# Patient Record
Sex: Female | Born: 1965 | Race: Black or African American | Hispanic: No | Smoking: Never smoker
Health system: Southern US, Community
[De-identification: ages and names within clinical notes are randomized; demographics above are authoritative.]

## PROBLEM LIST (undated history)

## (undated) DIAGNOSIS — T7840XA Allergy, unspecified, initial encounter: Secondary | ICD-10-CM

## (undated) DIAGNOSIS — I10 Essential (primary) hypertension: Secondary | ICD-10-CM

## (undated) HISTORY — DX: Allergy, unspecified, initial encounter: T78.40XA

## (undated) HISTORY — DX: Essential (primary) hypertension: I10

## (undated) HISTORY — PX: DILATION AND CURETTAGE OF UTERUS: SHX78

## (undated) HISTORY — PX: TUBAL LIGATION: SHX77

---

## 2006-03-31 ENCOUNTER — Ambulatory Visit: Payer: Self-pay | Admitting: Family Medicine

## 2007-03-06 ENCOUNTER — Ambulatory Visit: Payer: Self-pay | Admitting: Family Medicine

## 2007-04-24 ENCOUNTER — Ambulatory Visit: Payer: Self-pay | Admitting: Specialist

## 2009-10-26 ENCOUNTER — Ambulatory Visit: Payer: Self-pay | Admitting: Specialist

## 2012-03-13 ENCOUNTER — Ambulatory Visit: Payer: Self-pay | Admitting: Family Medicine

## 2013-11-10 DIAGNOSIS — N393 Stress incontinence (female) (male): Secondary | ICD-10-CM | POA: Insufficient documentation

## 2013-11-10 DIAGNOSIS — N281 Cyst of kidney, acquired: Secondary | ICD-10-CM | POA: Insufficient documentation

## 2015-02-16 ENCOUNTER — Other Ambulatory Visit: Payer: Self-pay | Admitting: Family Medicine

## 2015-02-17 ENCOUNTER — Other Ambulatory Visit: Payer: Self-pay

## 2015-02-17 MED ORDER — TRIAMTERENE-HCTZ 37.5-25 MG PO TABS: 1.0000 | ORAL_TABLET | Freq: Every day | ORAL | Status: DC

## 2015-03-21 ENCOUNTER — Ambulatory Visit (INDEPENDENT_AMBULATORY_CARE_PROVIDER_SITE_OTHER): Payer: BLUE CROSS/BLUE SHIELD | Admitting: Family Medicine

## 2015-03-21 ENCOUNTER — Encounter: Payer: Self-pay | Admitting: Family Medicine

## 2015-03-21 VITALS — BP 122/78 | HR 87 | Temp 98.1°F | Resp 16 | Ht 65.0 in | Wt 184.9 lb

## 2015-03-21 DIAGNOSIS — Z23 Encounter for immunization: Secondary | ICD-10-CM | POA: Diagnosis not present

## 2015-03-21 DIAGNOSIS — M2669 Other specified disorders of temporomandibular joint: Secondary | ICD-10-CM | POA: Diagnosis not present

## 2015-03-21 DIAGNOSIS — E669 Obesity, unspecified: Secondary | ICD-10-CM | POA: Diagnosis not present

## 2015-03-21 DIAGNOSIS — I10 Essential (primary) hypertension: Secondary | ICD-10-CM

## 2015-03-21 DIAGNOSIS — M26629 Arthralgia of temporomandibular joint, unspecified side: Secondary | ICD-10-CM

## 2015-03-21 MED ORDER — PHENTERMINE HCL 37.5 MG PO CAPS: 37.5000 mg | ORAL_CAPSULE | ORAL | Status: DC

## 2015-03-21 MED ORDER — MELOXICAM 15 MG PO TABS: 15.0000 mg | ORAL_TABLET | Freq: Every day | ORAL | Status: DC

## 2015-03-21 NOTE — Patient Instructions (Signed)
Temporomandibular Problems  Temporomandibular joint (TMJ) dysfunction means there are problems with the joint between your jaw and your skull. This is a joint lined by cartilage like other joints in your body but also has a small disc in the joint which keeps the bones from rubbing on each other. These joints are like other joints and can get inflamed (sore) from arthritis and other problems. When this joint gets sore, it can cause headaches and pain in the jaw and the face. CAUSES  Usually the arthritic types of problems are caused by soreness in the joint. Soreness in the joint can also be caused by overuse. This may come from grinding your teeth. It may also come from mis-alignment in the joint. DIAGNOSIS Diagnosis of this condition can often be made by history and exam. Sometimes your caregiver may need X-rays or an MRI scan to determine the exact cause. It may be necessary to see your dentist to determine if your teeth and jaws are lined up correctly. TREATMENT  Most of the time this problem is not serious; however, sometimes it can persist (become chronic). When this happens medications that will cut down on inflammation (soreness) help. Sometimes a shot of cortisone into the joint will be helpful. If your teeth are not aligned it may help for your dentist to make a splint for your mouth that can help this problem. If no physical problems can be found, the problem may come from tension. If tension is found to be the cause, biofeedback or relaxation techniques may be helpful. HOME CARE INSTRUCTIONS   Later in the day, applications of ice packs may be helpful. Ice can be used in a plastic bag with a towel around it to prevent frostbite to skin. This may be used about every 2 hours for 20 to 30 minutes, as needed while awake, or as directed by your caregiver.  Only take over-the-counter or prescription medicines for pain, discomfort, or fever as directed by your caregiver.  If physical therapy was  prescribed, follow your caregiver's directions.  Wear mouth appliances as directed if they were given. Document Released: 03/26/2001 Document Revised: 09/23/2011 Document Reviewed: 07/03/2008 ExitCare Patient Information 2015 [employer]. This information is not intended to replace advice given to you by your health care provider. Make sure you discuss any questions you have with your health care provider.  

## 2015-03-21 NOTE — Progress Notes (Signed)
Name: Amy May   MRN: [id number]    DOB: May 13, 1966   Date:03/21/2015       Progress Note  Subjective  Chief Complaint  Chief Complaint  Patient presents with  . Headache    patient states that she has had a headache for 2 weeks. patient stated that it throbs and it was nonresponsive to ibuprofen. patient has no other sx so she is unsure if it is sinus related  . Obesity  . Hypertension    HPI Hypertension   Patient presents for follow-up of hypertension. It has been present for over  5 years.  Patient states that there is compliance with medical regimen which consists of diuretic . There is no end organ disease. Cardiac risk factors include hypertension hyperlipidemia and diabetes.  Exercise regimen consist of minimal .  Diet consist of diuretic.   Obesity  Patient has a history of obesity for over 5 years.  Attempts at weight loss have included diet and exercise and phentermine intermittently .  Results of this regimen  have been effective when compliant .  Patient now voices and interest in weight loss by  good .  TMJ pain and headache  Patient complains of right-sided headache pain which usually is present in the morning. It is right in front of the right ear. It radiates sometimes antipsychotic the right face. There is no photophobia phonophobia photophobia and no focal weakness or visual disturbance. There is no tenderness over the temporal artery. There is no history of migraine headaches. Headaches are usually present in the morning. Patient admits to jaw clenching at night and to chewing on ice and hard candies. She has tried over-the-counter ibuprofen with no significant improvement.   Past Medical History  Diagnosis Date  . Allergy   . Hypertension     Social History  Substance Use Topics  . Smoking status: Never Smoker   . Smokeless tobacco: Not on file  . Alcohol Use: No     Current outpatient prescriptions:  .  phentermine (ADIPEX-P) 37.5 MG tablet, ,  Disp: , Rfl:  .  triamterene-hydrochlorothiazide (MAXZIDE-25) 37.5-25 MG per tablet, Take 1 tablet by mouth daily., Disp: 30 tablet, Rfl: 0  No Known Allergies  ROS   Objective  Filed Vitals:   03/21/15 0753  BP: 122/78  Pulse: 87  Temp: 98.1 F (36.7 C)  TempSrc: Oral  Resp: 16  Height: 5\' 5"  (1.651 m)  Weight: 184 lb 14.4 oz (83.87 kg)  SpO2: 99%     Physical Exam  Constitutional: She is oriented to person, place, and time and well-developed, well-nourished, and in no distress.  HENT:  Head: Normocephalic.  Nose: Nose normal.  Marked tenderness to palpation over the right temporomandibular joint with some crepitus is noted to well  Eyes: EOM are normal. Pupils are equal, round, and reactive to light.  Neck: Normal range of motion. No thyromegaly present.  Cardiovascular: Normal rate, regular rhythm and normal heart sounds.   No murmur heard. Pulmonary/Chest: Effort normal and breath sounds normal.  Musculoskeletal: Normal range of motion. She exhibits no edema.  Neurological: She is alert and oriented to person, place, and time. No cranial nerve deficit. Gait normal.  Skin: Skin is warm and dry. No rash noted.  Psychiatric: Memory and affect normal.      Assessment & Plan  1. TMJ pain dysfunction syndrome If no improvement consider short course of prednisone well-controlled - meloxicam (MOBIC) 15 MG tablet; Take 1 tablet (15 mg  total) by mouth daily.  Dispense: 30 tablet; Refill: 0  2. Essential hypertension Well-controlled - Comprehensive Metabolic Panel (CMET) - TSH  3. Obesity Exacerbated - phentermine 37.5 MG capsule; Take 1 capsule (37.5 mg total) by mouth every morning.  Dispense: 30 capsule; Refill: 0  4. Need for influenza vaccination Given today - Flu Vaccine QUAD 36+ mos PF IM (Fluarix & Fluzone Quad PF)

## 2015-04-19 ENCOUNTER — Ambulatory Visit: Payer: BLUE CROSS/BLUE SHIELD | Admitting: Family Medicine

## 2015-05-29 ENCOUNTER — Telehealth: Payer: Self-pay | Admitting: Family Medicine

## 2015-05-29 NOTE — Telephone Encounter (Signed)
Requesting refill on phentermine.  

## 2015-05-31 ENCOUNTER — Other Ambulatory Visit: Payer: Self-pay | Admitting: Family Medicine

## 2015-05-31 DIAGNOSIS — E669 Obesity, unspecified: Secondary | ICD-10-CM

## 2015-05-31 MED ORDER — PHENTERMINE HCL 37.5 MG PO CAPS: 37.5000 mg | ORAL_CAPSULE | ORAL | Status: DC

## 2015-07-12 ENCOUNTER — Encounter: Payer: Self-pay | Admitting: Family Medicine

## 2015-07-12 ENCOUNTER — Ambulatory Visit (INDEPENDENT_AMBULATORY_CARE_PROVIDER_SITE_OTHER): Payer: BLUE CROSS/BLUE SHIELD | Admitting: Family Medicine

## 2015-07-12 VITALS — BP 124/80 | HR 89 | Temp 98.0°F | Resp 16 | Ht 65.0 in | Wt 184.4 lb

## 2015-07-12 DIAGNOSIS — E6609 Other obesity due to excess calories: Secondary | ICD-10-CM | POA: Insufficient documentation

## 2015-07-12 DIAGNOSIS — E669 Obesity, unspecified: Secondary | ICD-10-CM | POA: Diagnosis not present

## 2015-07-12 DIAGNOSIS — I10 Essential (primary) hypertension: Secondary | ICD-10-CM | POA: Diagnosis not present

## 2015-07-12 DIAGNOSIS — E785 Hyperlipidemia, unspecified: Secondary | ICD-10-CM | POA: Diagnosis not present

## 2015-07-12 DIAGNOSIS — Z6832 Body mass index (BMI) 32.0-32.9, adult: Secondary | ICD-10-CM

## 2015-07-12 MED ORDER — PHENTERMINE HCL 37.5 MG PO CAPS: 37.5000 mg | ORAL_CAPSULE | ORAL | Status: DC

## 2015-07-12 NOTE — Progress Notes (Signed)
Name: Amy May   MRN: [id number]    DOB: 1965-08-10   Date:07/12/2015       Progress Note  Subjective  Chief Complaint  Chief Complaint  Patient presents with  . Hypertension    follow up  . Obesity    HPI  Hypertension   Patient presents for follow-up of hypertension. It has been present for over 5 years.  Patient states that there is compliance with medical regimen which consists of Maxide 25 . There is no end organ disease. Cardiac risk factors include hypertension hyperlipidemia and diabetes.  Exercise regimen consist of walking .  Diet consist of some calorie restriction  Obesity  Patient has a history of obesity for over 5 years.  Attempts at weight loss have included diet and exercise and diet and phentermine .  Results of this regimen  have been phentermine .  Patient now voices and interest in weight loss by  intermittent . Marland Kitchen Her weight has been essentially remained stable over the holidays.  Past Medical History  Diagnosis Date  . Allergy   . Hypertension     Social History  Substance Use Topics  . Smoking status: Never Smoker   . Smokeless tobacco: Not on file  . Alcohol Use: No     Current outpatient prescriptions:  .  phentermine (ADIPEX-P) 37.5 MG tablet, , Disp: , Rfl:  .  phentermine 37.5 MG capsule, Take 1 capsule (37.5 mg total) by mouth every morning., Disp: 30 capsule, Rfl: 0 .  triamterene-hydrochlorothiazide (MAXZIDE-25) 37.5-25 MG per tablet, Take 1 tablet by mouth daily., Disp: 30 tablet, Rfl: 0  No Known Allergies  Review of Systems  Constitutional: Negative for fever, chills and weight loss.  HENT: Negative for congestion, hearing loss, sore throat and tinnitus.   Eyes: Negative for blurred vision, double vision and redness.  Respiratory: Negative for cough, hemoptysis and shortness of breath.   Cardiovascular: Negative for chest pain, palpitations, orthopnea, claudication and leg swelling.  Gastrointestinal: Negative for heartburn,  nausea, vomiting, diarrhea, constipation and blood in stool.  Genitourinary: Negative for dysuria, urgency, frequency and hematuria.  Musculoskeletal: Negative for myalgias, back pain, joint pain, falls and neck pain.  Skin: Negative for itching.  Neurological: Negative for dizziness, tingling, tremors, focal weakness, seizures, loss of consciousness, weakness and headaches.  Endo/Heme/Allergies: Does not bruise/bleed easily.  Psychiatric/Behavioral: Negative for depression and substance abuse. The patient is not nervous/anxious and does not have insomnia.      Objective  Filed Vitals:   07/12/15 0832  BP: 124/80  Pulse: 89  Temp: 98 F (36.7 C)  TempSrc: Oral  Resp: 16  Height: 5\' 5"  (1.651 m)  Weight: 184 lb 6.4 oz (83.643 kg)  SpO2: 98%     Physical Exam  Constitutional: She is oriented to person, place, and time and well-developed, well-nourished, and in no distress.  Modestly obese in no acute distress  HENT:  Head: Normocephalic.  Eyes: EOM are normal. Pupils are equal, round, and reactive to light.  Neck: Normal range of motion. No thyromegaly present.  Cardiovascular: Normal rate, regular rhythm and normal heart sounds.   No murmur heard. Pulmonary/Chest: Effort normal and breath sounds normal.  Abdominal: Soft. Bowel sounds are normal.  Musculoskeletal: Normal range of motion. She exhibits no edema.  Neurological: She is alert and oriented to person, place, and time. No cranial nerve deficit. Gait normal.  Skin: Skin is warm and dry. No rash noted.  Psychiatric: Memory and affect normal.  Assessment & Plan  1. Essential hypertension Well-controlled - Comprehensive Metabolic Panel (CMET) - Lipid panel - TSH  2. Obesity Diet and exercise along with phentermine recheck in 2 months - phentermine 37.5 MG capsule; Take 1 capsule (37.5 mg total) by mouth every morning.  Dispense: 30 capsule; Refill: 1 - Comprehensive Metabolic Panel (CMET) - TSH  3.  Hyperlipidemia labs - Lipid panel

## 2015-07-13 LAB — LIPID PANEL
CHOL/HDL RATIO: 3.3 ratio (ref 0.0–4.4)
CHOLESTEROL TOTAL: 179 mg/dL (ref 100–199)
HDL: 54 mg/dL (ref >39)
LDL CALC: 106 mg/dL — AB (ref 0–99)
Triglycerides: 96 mg/dL (ref 0–149)
VLDL CHOLESTEROL CAL: 19 mg/dL (ref 5–40)

## 2015-07-13 LAB — COMPREHENSIVE METABOLIC PANEL
ALBUMIN: 4.4 g/dL (ref 3.5–5.5)
ALT: 19 IU/L (ref 0–32)
AST: 19 IU/L (ref 0–40)
Albumin/Globulin Ratio: 1.5 (ref 1.1–2.5)
Alkaline Phosphatase: 89 IU/L (ref 39–117)
BUN / CREAT RATIO: 17 (ref 9–23)
BUN: 15 mg/dL (ref 6–24)
Bilirubin Total: 0.3 mg/dL (ref 0.0–1.2)
CALCIUM: 9.5 mg/dL (ref 8.7–10.2)
CO2: 24 mmol/L (ref 18–29)
CREATININE: 0.9 mg/dL (ref 0.57–1.00)
Chloride: 103 mmol/L (ref 96–106)
GFR, EST AFRICAN AMERICAN: 86 mL/min/{1.73_m2} (ref >59)
GFR, EST NON AFRICAN AMERICAN: 75 mL/min/{1.73_m2} (ref >59)
GLOBULIN, TOTAL: 2.9 g/dL (ref 1.5–4.5)
GLUCOSE: 95 mg/dL (ref 65–99)
Potassium: 4.6 mmol/L (ref 3.5–5.2)
Sodium: 140 mmol/L (ref 134–144)
TOTAL PROTEIN: 7.3 g/dL (ref 6.0–8.5)

## 2015-07-13 LAB — TSH: TSH: 0.527 u[IU]/mL (ref 0.450–4.500)

## 2015-07-18 ENCOUNTER — Telehealth: Payer: Self-pay | Admitting: Emergency Medicine

## 2015-07-18 NOTE — Telephone Encounter (Signed)
Patient notified of labs.   

## 2015-07-19 ENCOUNTER — Encounter: Payer: Self-pay | Admitting: *Deleted

## 2015-07-27 ENCOUNTER — Ambulatory Visit: Payer: Self-pay | Admitting: General Surgery

## 2015-08-03 ENCOUNTER — Encounter: Payer: Self-pay | Admitting: *Deleted

## 2015-08-09 ENCOUNTER — Encounter: Payer: Self-pay | Admitting: Family Medicine

## 2015-09-12 ENCOUNTER — Ambulatory Visit: Payer: BLUE CROSS/BLUE SHIELD | Admitting: Family Medicine

## 2015-10-26 ENCOUNTER — Ambulatory Visit: Payer: BLUE CROSS/BLUE SHIELD | Admitting: Family Medicine

## 2015-11-29 ENCOUNTER — Ambulatory Visit: Payer: Self-pay | Admitting: General Surgery

## 2015-12-14 ENCOUNTER — Encounter: Payer: Self-pay | Admitting: *Deleted

## 2015-12-21 ENCOUNTER — Ambulatory Visit: Payer: Self-pay | Admitting: General Surgery

## 2016-01-11 ENCOUNTER — Encounter: Payer: Self-pay | Admitting: *Deleted

## 2016-10-22 ENCOUNTER — Other Ambulatory Visit: Payer: Self-pay | Admitting: Podiatry

## 2016-11-20 ENCOUNTER — Ambulatory Visit: Admission: RE | Admit: 2016-11-20 | Payer: 59 | Source: Ambulatory Visit | Admitting: Podiatry

## 2016-11-20 ENCOUNTER — Encounter: Admission: RE | Payer: Self-pay | Source: Ambulatory Visit

## 2016-11-20 SURGERY — CORRECTION, HALLUX VALGUS
Anesthesia: General | Laterality: Left

## 2016-12-25 ENCOUNTER — Encounter: Payer: BLUE CROSS/BLUE SHIELD | Admitting: Family Medicine

## 2017-01-16 ENCOUNTER — Encounter: Payer: Self-pay | Admitting: Obstetrics & Gynecology

## 2017-01-20 ENCOUNTER — Ambulatory Visit: Payer: BLUE CROSS/BLUE SHIELD | Admitting: Obstetrics & Gynecology

## 2017-02-07 ENCOUNTER — Encounter: Payer: BLUE CROSS/BLUE SHIELD | Admitting: Family Medicine

## 2018-04-09 ENCOUNTER — Encounter: Payer: BLUE CROSS/BLUE SHIELD | Admitting: Family Medicine

## 2018-04-30 ENCOUNTER — Encounter: Payer: Self-pay | Admitting: Family Medicine

## 2018-04-30 ENCOUNTER — Ambulatory Visit (INDEPENDENT_AMBULATORY_CARE_PROVIDER_SITE_OTHER): Payer: BLUE CROSS/BLUE SHIELD | Admitting: Family Medicine

## 2018-04-30 VITALS — BP 118/72 | HR 88 | Temp 98.0°F | Resp 14 | Ht 65.0 in | Wt 192.8 lb

## 2018-04-30 DIAGNOSIS — I10 Essential (primary) hypertension: Secondary | ICD-10-CM | POA: Diagnosis not present

## 2018-04-30 DIAGNOSIS — N281 Cyst of kidney, acquired: Secondary | ICD-10-CM

## 2018-04-30 DIAGNOSIS — Z1212 Encounter for screening for malignant neoplasm of rectum: Secondary | ICD-10-CM

## 2018-04-30 DIAGNOSIS — E785 Hyperlipidemia, unspecified: Secondary | ICD-10-CM

## 2018-04-30 DIAGNOSIS — Z1211 Encounter for screening for malignant neoplasm of colon: Secondary | ICD-10-CM

## 2018-04-30 DIAGNOSIS — Z114 Encounter for screening for human immunodeficiency virus [HIV]: Secondary | ICD-10-CM

## 2018-04-30 DIAGNOSIS — R52 Pain, unspecified: Secondary | ICD-10-CM | POA: Insufficient documentation

## 2018-04-30 DIAGNOSIS — Z1239 Encounter for other screening for malignant neoplasm of breast: Secondary | ICD-10-CM

## 2018-04-30 DIAGNOSIS — Z6832 Body mass index (BMI) 32.0-32.9, adult: Secondary | ICD-10-CM

## 2018-04-30 DIAGNOSIS — M25511 Pain in right shoulder: Secondary | ICD-10-CM

## 2018-04-30 DIAGNOSIS — E6609 Other obesity due to excess calories: Secondary | ICD-10-CM

## 2018-04-30 DIAGNOSIS — G8929 Other chronic pain: Secondary | ICD-10-CM

## 2018-04-30 DIAGNOSIS — E66811 Obesity, class 1: Secondary | ICD-10-CM

## 2018-04-30 DIAGNOSIS — N644 Mastodynia: Secondary | ICD-10-CM

## 2018-04-30 MED ORDER — LORCASERIN HCL 10 MG PO TABS: 10.0000 mg | ORAL_TABLET | Freq: Two times a day (BID) | ORAL | 1 refills | Status: DC

## 2018-04-30 NOTE — Progress Notes (Signed)
Name: Amy May   MRN: [id number]    DOB: 1966/03/07   Date:04/30/2018       Progress Note  Subjective  Chief Complaint  No chief complaint on file.   HPI  Body Pain: RIGHT shoulder hurts intermittently especially worse after working at [employer].  She recently started back walking and has had joint pain in her hips and bottom of her feet.  Works at [employer] as Scientist, water quality; also works for [employer] and has to wear dress shoes.  She notes that wearing flats/dress shoes causes significant pain to her knees, hips, and feet (has bunions on bilateral feet), and she requests a note to allow her to wear sneakers to work which we will provide.  We will check labs today. Denies decreased AROM, no bony tenderness, no weakness/numbness.  Breast Tenderness: This occurs intermittently; always tender, but if she gets excited or jumps/runs, she feels a stinging/tender sensation that lasts for about 1 minute. Due for mammogram; has history of dense breast tissue. No nipple discharge, rashes, skin discoloration, no nipple inversion.   Renal Cyst: Sees urology and this has been stable - sees them every 2 years. Denies any urinary symptoms.   Health Maintenance: Needs mammogram, colonoscopy, and HIV screening - will order these today  Obesity: She is walking more now - is trying to walk 1-3 miles a day. Baking more than frying now; gave up sodas, but her weight still won't budge. Declines nutritionist.  Discussed weight management in detail; discussed weight loss medication in detail and she would like to trial belviq.  We will provide this today.    HTN:  -does take medications as prescribed - current regimen includes MAxzide 37.5-25.  - taking medications as instructed, no medication side effects noted, no TIAs, no chest pain on exertion, no dyspnea on exertion, no swelling of ankles, no orthostatic dizziness or lightheadedness, no palpitations - DASH diet discussed - pt does not follow a low sodium  diet; salt added to cooking   Patient Active Problem List   Diagnosis Date Noted  . Essential hypertension 07/12/2015  . Obesity 07/12/2015  . Hyperlipidemia 07/12/2015  . Female genuine stress incontinence 11/10/2013  . Acquired cyst of kidney 11/10/2013    Past Surgical History:  Procedure Laterality Date  . DILATION AND CURETTAGE OF UTERUS    . TUBAL LIGATION      Family History  Family history unknown: Yes    Social History   Socioeconomic History  . Marital status: Divorced    Spouse name: Not on file  . Number of children: 2  . Years of education: Not on file  . Highest education level: Not on file  Occupational History  . Occupation: Retail buyer: [employer]  Social Needs  . Financial resource strain: Not hard at all  . Food insecurity:    Worry: Never true    Inability: Never true  . Transportation needs:    Medical: No    Non-medical: No  Tobacco Use  . Smoking status: Never Smoker  . Smokeless tobacco: Never Used  Substance and Sexual Activity  . Alcohol use: No    Alcohol/week: 0.0 standard drinks  . Drug use: No  . Sexual activity: Not Currently    Partners: Male  Lifestyle  . Physical activity:    Days per week: 3 days    Minutes per session: 60 min  . Stress: Not at all  Relationships  . Social connections:  Talks on phone: More than three times a week    Gets together: More than three times a week    Attends religious service: More than 4 times per year    Active member of club or organization: Yes    Attends meetings of clubs or organizations: More than 4 times per year    Relationship status: Divorced  . Intimate partner violence:    Fear of current or ex partner: No    Emotionally abused: No    Physically abused: No    Forced sexual activity: No  Other Topics Concern  . Not on file  Social History Narrative  . Not on file     Current Outpatient Medications:  .  triamterene-hydrochlorothiazide (MAXZIDE-25) 37.5-25 MG  per tablet, Take 1 tablet by mouth daily., Disp: 30 tablet, Rfl: 0 .  phentermine (ADIPEX-P) 37.5 MG tablet, , Disp: , Rfl:  .  phentermine 37.5 MG capsule, Take 1 capsule (37.5 mg total) by mouth every morning. (Patient not taking: Reported on 04/30/2018), Disp: 30 capsule, Rfl: 1 .  phentermine 37.5 MG capsule, Take 1 capsule (37.5 mg total) by mouth every morning. (Patient not taking: Reported on 04/30/2018), Disp: 30 capsule, Rfl: 1  No Known Allergies  I personally reviewed active problem list, medication list, allergies, family history, social history, health maintenance, notes from last encounter, lab results with the patient/caregiver today.   ROS Ten systems reviewed and is negative except as mentioned in HPI  Objective  Vitals:   04/30/18 1246  BP: 118/72  Pulse: 88  Resp: 14  Temp: 98 F (36.7 C)  TempSrc: Oral  Weight: 192 lb 12.8 oz (87.5 kg)  Height: 5' 5"  (1.651 m)    Body mass index is 32.08 kg/m.  Physical Exam Constitutional: Patient appears well-developed and well-nourished. No distress.  HENT: Head: Normocephalic and atraumatic. Eyes: Conjunctivae and EOM are normal. Pupils are equal, round, and reactive to light. No scleral icterus.   Neck: Normal range of motion. Neck supple. No JVD present. No thyromegaly present.  Cardiovascular: Normal rate, regular rhythm and normal heart sounds.  No murmur heard. No BLE edema. Pulmonary/Chest: Effort normal and breath sounds normal. No respiratory distress. Abdominal: Soft. Bowel sounds are normal, no distension. There is no tenderness. no masses Breast: no lumps or masses, no nipple discharge or rashes Musculoskeletal: Normal range of motion, no joint effusions. No gross deformities Neurological: he is alert and oriented to person, place, and time. No cranial nerve deficit. Coordination, balance, strength, speech and gait are normal.  Skin: Skin is warm and dry. No rash noted. No erythema.  Psychiatric: Patient has  a normal mood and affect. behavior is normal. Judgment and thought content normal.  No results found for this or any previous visit (from the past 72 hour(s)).  PHQ2/9: Depression screen [facility] 2/9 04/30/2018 07/12/2015 03/21/2015  Decreased Interest 0 0 0  Down, Depressed, Hopeless 0 0 0  PHQ - 2 Score 0 0 0  Altered sleeping 0 - -  Tired, decreased energy 0 - -  Change in appetite 0 - -  Feeling bad or failure about yourself  0 - -  Trouble concentrating 0 - -  Moving slowly or fidgety/restless 0 - -  Suicidal thoughts 0 - -  PHQ-9 Score 0 - -  Difficult doing work/chores Not difficult at all - -    Fall Risk: Fall Risk  04/30/2018 07/12/2015 03/21/2015  Falls in the past year? No No No  Assessment & Plan  1. Essential hypertension - Stable, does not need refill today, but we will provide when needed - COMPLETE METABOLIC PANEL WITH GFR  2. Class 1 obesity due to excess calories with serious comorbidity and body mass index (BMI) of 32.0 to 32.9 in adult - Discussed importance of 150 minutes of physical activity weekly, eat two servings of fish weekly, eat one serving of tree nuts ( cashews, pistachios, pecans, almonds.Marland Kitchen) every other day, eat 6 servings of fruit/vegetables daily and drink plenty of water and avoid sweet beverages.  - COMPLETE METABOLIC PANEL WITH GFR - Lipid panel - Lorcaserin HCl 10 MG TABS; Take 10 mg by mouth 2 (two) times daily.  Dispense: 60 tablet; Refill: 1 - TSH  3. Acquired cyst of kidney - CMP per orders.  4. Hyperlipidemia, unspecified hyperlipidemia type - Lipid panel  5. Breast tenderness in female - Prolactin - Mammogram per orders - Breast examination is benign.  6. Body aches - ANA - Sed Rate (ESR)  7. Chronic right shoulder pain - We will continue to monitor, ANA and Sed rate per orders; will consider ortho referral if not improving.  8. Encounter for screening for HIV - HIV Antibody (routine testing w rflx)  9. Breast cancer  screening - MM 3D SCREEN BREAST BILATERAL; Future  10. Screening for colorectal cancer - Ambulatory referral to Gastroenterology

## 2018-05-01 ENCOUNTER — Other Ambulatory Visit: Payer: Self-pay

## 2018-05-01 DIAGNOSIS — Z1211 Encounter for screening for malignant neoplasm of colon: Secondary | ICD-10-CM

## 2018-05-01 LAB — LIPID PANEL
CHOL/HDL RATIO: 4.3 (calc) (ref <5.0)
Cholesterol: 175 mg/dL (ref <200)
HDL: 41 mg/dL — AB (ref >50)
NON-HDL CHOLESTEROL (CALC): 134 mg/dL — AB (ref <130)
TRIGLYCERIDES: 412 mg/dL — AB (ref <150)

## 2018-05-01 LAB — SEDIMENTATION RATE: SED RATE: 19 mm/h (ref 0–30)

## 2018-05-01 LAB — COMPLETE METABOLIC PANEL WITH GFR
AG Ratio: 1.5 (calc) (ref 1.0–2.5)
ALT: 26 U/L (ref 6–29)
AST: 21 U/L (ref 10–35)
Albumin: 4.4 g/dL (ref 3.6–5.1)
Alkaline phosphatase (APISO): 78 U/L (ref 33–130)
BUN: 16 mg/dL (ref 7–25)
CALCIUM: 9.7 mg/dL (ref 8.6–10.4)
CO2: 29 mmol/L (ref 20–32)
CREATININE: 0.95 mg/dL (ref 0.50–1.05)
Chloride: 104 mmol/L (ref 98–110)
GFR, EST NON AFRICAN AMERICAN: 68 mL/min/{1.73_m2} (ref >=60)
GFR, Est African American: 79 mL/min/{1.73_m2} (ref >=60)
GLUCOSE: 115 mg/dL (ref 65–139)
Globulin: 3 g/dL (calc) (ref 1.9–3.7)
Potassium: 3.9 mmol/L (ref 3.5–5.3)
Sodium: 140 mmol/L (ref 135–146)
Total Bilirubin: 0.4 mg/dL (ref 0.2–1.2)
Total Protein: 7.4 g/dL (ref 6.1–8.1)

## 2018-05-01 LAB — HIV ANTIBODY (ROUTINE TESTING W REFLEX): HIV: NONREACTIVE

## 2018-05-01 LAB — PROLACTIN: Prolactin: 4.3 ng/mL

## 2018-05-01 LAB — TSH: TSH: 0.33 mIU/L — ABNORMAL LOW

## 2018-05-04 ENCOUNTER — Other Ambulatory Visit: Payer: Self-pay | Admitting: Family Medicine

## 2018-05-04 DIAGNOSIS — R7989 Other specified abnormal findings of blood chemistry: Secondary | ICD-10-CM

## 2018-05-08 ENCOUNTER — Telehealth: Payer: Self-pay | Admitting: Family Medicine

## 2018-05-08 NOTE — Telephone Encounter (Signed)
Copied from [place] [id number]. Topic: Quick Communication - See Telephone Encounter >> May 08, 2018  8:58 AM Ahmed Prima L wrote: CRM for notification. See Telephone encounter for: 05/08/18. Estill Bamberg with [employer] called to get additional information on prior authorization for [place]. Her call back is [phone] reference number is [id number]

## 2018-05-12 NOTE — Telephone Encounter (Signed)
Order sent on cover my meds

## 2018-05-19 ENCOUNTER — Telehealth: Payer: Self-pay | Admitting: Emergency Medicine

## 2018-05-19 NOTE — Telephone Encounter (Signed)
In the process of yawning she noticed swollen glands at neck. No pain, but could not swallow and felt like throat was closing up. Patient was notified to make appointment and TSH was to be repeated in 6 weeks.

## 2018-05-19 NOTE — Telephone Encounter (Signed)
Patient coming in for appointment

## 2018-05-19 NOTE — Telephone Encounter (Signed)
Documentation reviewed. If she feels that she is unable to swallow and/or having difficulty breathing, she needs to go to the ER. Otherwise I will see her for scheduled office appointment.

## 2018-05-22 ENCOUNTER — Encounter: Payer: Self-pay | Admitting: *Deleted

## 2018-05-25 ENCOUNTER — Ambulatory Visit
Admission: RE | Admit: 2018-05-25 | Discharge: 2018-05-25 | Disposition: A | Discharge status: Home / Self Care | Payer: BLUE CROSS/BLUE SHIELD | Source: Ambulatory Visit | Attending: Gastroenterology | Admitting: Gastroenterology

## 2018-05-25 ENCOUNTER — Ambulatory Visit: Payer: BLUE CROSS/BLUE SHIELD | Admitting: Anesthesiology

## 2018-05-25 ENCOUNTER — Encounter: Payer: Self-pay | Admitting: *Deleted

## 2018-05-25 ENCOUNTER — Encounter
Admission: RE | Disposition: A | Discharge status: Home / Self Care | Payer: Self-pay | Source: Ambulatory Visit | Attending: Gastroenterology

## 2018-05-25 ENCOUNTER — Other Ambulatory Visit: Payer: Self-pay | Admitting: Family Medicine

## 2018-05-25 ENCOUNTER — Ambulatory Visit: Payer: BLUE CROSS/BLUE SHIELD | Admitting: Family Medicine

## 2018-05-25 DIAGNOSIS — Z79899 Other long term (current) drug therapy: Secondary | ICD-10-CM | POA: Diagnosis not present

## 2018-05-25 DIAGNOSIS — I10 Essential (primary) hypertension: Secondary | ICD-10-CM | POA: Diagnosis not present

## 2018-05-25 DIAGNOSIS — D123 Benign neoplasm of transverse colon: Secondary | ICD-10-CM | POA: Diagnosis not present

## 2018-05-25 DIAGNOSIS — Z1211 Encounter for screening for malignant neoplasm of colon: Secondary | ICD-10-CM | POA: Diagnosis not present

## 2018-05-25 HISTORY — PX: COLONOSCOPY WITH PROPOFOL: SHX5780

## 2018-05-25 LAB — POCT PREGNANCY, URINE: Preg Test, Ur: NEGATIVE

## 2018-05-25 SURGERY — COLONOSCOPY WITH PROPOFOL
Anesthesia: General

## 2018-05-25 MED ORDER — LIDOCAINE HCL (PF) 2 % IJ SOLN
INTRAMUSCULAR | Status: AC
  Filled 2018-05-25: qty 10

## 2018-05-25 MED ORDER — SUCCINYLCHOLINE CHLORIDE 20 MG/ML IJ SOLN
INTRAMUSCULAR | Status: AC
  Filled 2018-05-25: qty 1

## 2018-05-25 MED ORDER — PROPOFOL 10 MG/ML IV BOLUS
INTRAVENOUS | Status: DC | PRN
  Administered 2018-05-25: 30 mg via INTRAVENOUS
  Administered 2018-05-25: 70 mg via INTRAVENOUS

## 2018-05-25 MED ORDER — PROPOFOL 500 MG/50ML IV EMUL
INTRAVENOUS | Status: DC | PRN
  Administered 2018-05-25: 160 ug/kg/min via INTRAVENOUS

## 2018-05-25 MED ORDER — PROPOFOL 500 MG/50ML IV EMUL
INTRAVENOUS | Status: AC
  Filled 2018-05-25: qty 200

## 2018-05-25 MED ORDER — PHENYLEPHRINE HCL 10 MG/ML IJ SOLN
INTRAMUSCULAR | Status: DC | PRN
  Administered 2018-05-25 (×2): 100 ug via INTRAVENOUS

## 2018-05-25 MED ORDER — GLYCOPYRROLATE 0.2 MG/ML IJ SOLN
INTRAMUSCULAR | Status: AC
  Filled 2018-05-25: qty 1

## 2018-05-25 MED ORDER — SODIUM CHLORIDE 0.9 % IV SOLN
INTRAVENOUS | Status: DC
  Administered 2018-05-25: 08:00:00 via INTRAVENOUS

## 2018-05-25 MED ORDER — PHENYLEPHRINE HCL 10 MG/ML IJ SOLN
INTRAMUSCULAR | Status: AC
  Filled 2018-05-25: qty 1

## 2018-05-25 NOTE — H&P (Signed)
Cephas Darby, MD [address]  [place]  [place], [place] [address]  Main: [phone]  Fax: [phone] Pager: [phone]  Primary Care Physician:  Hubbard Hartshorn, FNP Primary Gastroenterologist:  Dr. Cephas Darby  Pre-Procedure History & Physical: HPI:  Amy May is a 52 y.o. female is here for an colonoscopy.   Past Medical History:  Diagnosis Date  . Allergy   . Hypertension     Past Surgical History:  Procedure Laterality Date  . DILATION AND CURETTAGE OF UTERUS    . TUBAL LIGATION      Prior to Admission medications   Medication Sig Start Date End Date Taking? Authorizing Provider  Lorcaserin HCl 10 MG TABS Take 10 mg by mouth 2 (two) times daily. Patient not taking: Reported on 05/25/2018 04/30/18   Hubbard Hartshorn, FNP  triamterene-hydrochlorothiazide (MAXZIDE-25) 37.5-25 MG per tablet Take 1 tablet by mouth daily. 02/17/15   Ashok Norris, MD    Allergies as of 05/01/2018  . (No Known Allergies)    Family History  Family history unknown: Yes    Social History   Socioeconomic History  . Marital status: Divorced    Spouse name: Not on file  . Number of children: 2  . Years of education: Not on file  . Highest education level: Not on file  Occupational History  . Occupation: Retail buyer: [employer]  Social Needs  . Financial resource strain: Not hard at all  . Food insecurity:    Worry: Never true    Inability: Never true  . Transportation needs:    Medical: No    Non-medical: No  Tobacco Use  . Smoking status: Never Smoker  . Smokeless tobacco: Never Used  Substance and Sexual Activity  . Alcohol use: No    Alcohol/week: 0.0 standard drinks  . Drug use: No  . Sexual activity: Not Currently    Partners: Male  Lifestyle  . Physical activity:    Days per week: 3 days    Minutes per session: 60 min  . Stress: Not at all  Relationships  . Social connections:    Talks on phone: More than three times a week   Gets together: More than three times a week    Attends religious service: More than 4 times per year    Active member of club or organization: Yes    Attends meetings of clubs or organizations: More than 4 times per year    Relationship status: Divorced  . Intimate partner violence:    Fear of current or ex partner: No    Emotionally abused: No    Physically abused: No    Forced sexual activity: No  Other Topics Concern  . Not on file  Social History Narrative  . Not on file    Review of Systems: See HPI, otherwise negative ROS  Physical Exam: BP (!) 127/100   Pulse 77   Temp (!) 95.2 F (35.1 C) (Tympanic)   Resp 20   Ht 5\' 2"  (1.575 m)   Wt 88 kg   SpO2 99%   BMI 35.48 kg/m  General:   Alert,  pleasant and cooperative in NAD Head:  Normocephalic and atraumatic. Neck:  Supple; no masses or thyromegaly. Lungs:  Clear throughout to auscultation.    Heart:  Regular rate and rhythm. Abdomen:  Soft, nontender and nondistended. Normal bowel sounds, without guarding, and without rebound.   Neurologic:  Alert and  oriented x4;  grossly normal neurologically.  Impression/Plan: Amy May is here for an colonoscopy to be performed for colon cancer screening  Risks, benefits, limitations, and alternatives regarding  colonoscopy have been reviewed with the patient.  Questions have been answered.  All parties agreeable.   Sherri Sear, MD  05/25/2018, 8:15 AM

## 2018-05-25 NOTE — Transfer of Care (Signed)
Immediate Anesthesia Transfer of Care Note  Patient: MAELYN BERREY  Procedure(s) Performed: COLONOSCOPY WITH PROPOFOL (N/A )  Patient Location: PACU  Anesthesia Type:General  Level of Consciousness: drowsy  Airway & Oxygen Therapy: Patient Spontanous Breathing and Patient connected to nasal cannula oxygen  Post-op Assessment: Report given to RN and Post -op Vital signs reviewed and stable  Post vital signs: Reviewed and stable  Last Vitals:  Vitals Value Taken Time  BP 99/58 05/25/2018  8:43 AM  Temp 35.6 C 05/25/2018  8:43 AM  Pulse 86 05/25/2018  8:43 AM  Resp 14 05/25/2018  8:43 AM  SpO2 99 % 05/25/2018  8:43 AM  Vitals shown include unvalidated device data.  Last Pain:  Vitals:   05/25/18 0718  TempSrc: Tympanic  PainSc: 0-No pain         Complications: No apparent anesthesia complications

## 2018-05-25 NOTE — Anesthesia Post-op Follow-up Note (Signed)
Anesthesia QCDR form completed.        

## 2018-05-25 NOTE — Op Note (Signed)
[facility] Patient Name: Amy May Procedure Date: 05/25/2018 8:07 AM MRN: [id number] Account #: [id number] Date of Birth: [birth year] Admit Type: Outpatient Age: 52 Room: [facility] ENDO ROOM 4 Gender: Female Note Status: Finalized Procedure:            Colonoscopy Indications:          Screening for colorectal malignant neoplasm, This is                        the patient's first colonoscopy Providers:            Lin Landsman MD, MD Referring MD:         No Local Md, MD (Referring MD) Medicines:            Monitored Anesthesia Care Complications:        No immediate complications. Estimated blood loss: None. Procedure:            Pre-Anesthesia Assessment:                       - Prior to the procedure, a History and Physical was                        performed, and patient medications and allergies were                        reviewed. The patient is competent. The risks and                        benefits of the procedure and the sedation options and                        risks were discussed with the patient. All questions                        were answered and informed consent was obtained.                        Patient identification and proposed procedure were                        verified by the physician, the nurse, the                        anesthesiologist, the anesthetist and the technician in                        the pre-procedure area in the procedure room in the                        endoscopy suite. Mental Status Examination: alert and                        oriented. Airway Examination: normal oropharyngeal                        airway and neck mobility. Respiratory Examination:                        clear to auscultation. CV Examination: normal.  Prophylactic Antibiotics: The patient does not require                        prophylactic antibiotics. Prior Anticoagulants: The                         patient has taken no previous anticoagulant or                        antiplatelet agents. ASA Grade Assessment: II - A                        patient with mild systemic disease. After reviewing the                        risks and benefits, the patient was deemed in                        satisfactory condition to undergo the procedure. The                        anesthesia plan was to use monitored anesthesia care                        (MAC). Immediately prior to administration of                        medications, the patient was re-assessed for adequacy                        to receive sedatives. The heart rate, respiratory rate,                        oxygen saturations, blood pressure, adequacy of                        pulmonary ventilation, and response to care were                        monitored throughout the procedure. The physical status                        of the patient was re-assessed after the procedure.                       After obtaining informed consent, the colonoscope was                        passed under direct vision. Throughout the procedure,                        the patient's blood pressure, pulse, and oxygen                        saturations were monitored continuously. The                        Colonoscope was introduced through the anus and  advanced to the the cecum, identified by appendiceal                        orifice and ileocecal valve. The colonoscopy was                        performed without difficulty. The patient tolerated the                        procedure well. The quality of the bowel preparation                        was evaluated using the BBPS [facility], [facility] Bowel Preparation                        Scale) with scores of: Right Colon = 3, Transverse                        Colon = 3 and Left Colon = 3 (entire mucosa seen well                        with no residual staining, small fragments of stool or                         opaque liquid). The total BBPS score equals 9. Findings:      The perianal and digital rectal examinations were normal. Pertinent       negatives include normal sphincter tone and no palpable rectal lesions.      A diminutive polyp was found in the transverse colon. The polyp was       sessile. The polyp was removed with a cold biopsy forceps. Resection and       retrieval were complete.      The retroflexed view of the distal rectum and anal verge was normal and       showed no anal or rectal abnormalities.      The exam was otherwise without abnormality. Impression:           - One diminutive polyp in the transverse colon, removed                        with a cold biopsy forceps. Resected and retrieved.                       - The distal rectum and anal verge are normal on                        retroflexion view.                       - The examination was otherwise normal. Recommendation:       - Discharge patient to home (with escort).                       - Resume previous diet today.                       - Continue present medications.                       -  Await pathology results.                       - Repeat colonoscopy in 5-10 years for surveillance                        based on pathology results. Procedure Code(s):    --- Professional ---                       [phone], Colonoscopy, flexible; with biopsy, single or                        multiple Diagnosis Code(s):    --- Professional ---                       Z12.11, Encounter for screening for malignant neoplasm                        of colon                       D12.3, Benign neoplasm of transverse colon (hepatic                        flexure or splenic flexure) CPT copyright 2018 [employer]. All rights reserved. The codes documented in this report are preliminary and upon coder review may  be revised to meet current compliance requirements. Dr. Ulyess Mort Lin Landsman MD, MD 05/25/2018 8:41:22 AM This report has been signed electronically. Number of Addenda: 0 Note Initiated On: 05/25/2018 8:07 AM Scope Withdrawal Time: 0 hours 10 minutes 50 seconds  Total Procedure Duration: 0 hours 19 minutes 7 seconds       [facility]

## 2018-05-25 NOTE — Anesthesia Postprocedure Evaluation (Signed)
Anesthesia Post Note  Patient: [name]  Procedure(s) Performed: COLONOSCOPY WITH PROPOFOL (N/A )  Patient location during evaluation: Endoscopy Anesthesia Type: General Level of consciousness: awake and alert Pain management: pain level controlled Vital Signs Assessment: post-procedure vital signs reviewed and stable Respiratory status: spontaneous breathing and respiratory function stable Cardiovascular status: stable Anesthetic complications: no     Last Vitals:  Vitals:   05/25/18 0900 05/25/18 0907  BP:    Pulse: 77 72  Resp: 15 16  Temp:    SpO2: 100% 99%    Last Pain:  Vitals:   05/25/18 0718  TempSrc: Tympanic  PainSc: 0-No pain                 Caidence Higashi K

## 2018-05-25 NOTE — Anesthesia Procedure Notes (Signed)
Performed by: Pat Elicker, CRNA Pre-anesthesia Checklist: Patient identified, Emergency Drugs available, Suction available, Patient being monitored and Timeout performed Patient Re-evaluated:Patient Re-evaluated prior to induction Oxygen Delivery Method: Nasal cannula Induction Type: IV induction       

## 2018-05-25 NOTE — Anesthesia Preprocedure Evaluation (Signed)
Anesthesia Evaluation  Patient identified by MRN, date of birth, ID band Patient awake    Reviewed: Allergy & Precautions, NPO status , Patient's Chart, lab work & pertinent test results  History of Anesthesia Complications Negative for: history of anesthetic complications  Airway Mallampati: II       Dental   Pulmonary neg sleep apnea, neg COPD,           Cardiovascular hypertension, Pt. on medications (-) Past MI and (-) CHF (-) dysrhythmias (-) Valvular Problems/Murmurs     Neuro/Psych neg Seizures    GI/Hepatic Neg liver ROS, neg GERD  ,  Endo/Other  neg diabetes  Renal/GU Renal disease (renal cyst)     Musculoskeletal   Abdominal   Peds  Hematology   Anesthesia Other Findings   Reproductive/Obstetrics                             Anesthesia Physical Anesthesia Plan  ASA: II  Anesthesia Plan: General   Post-op Pain Management:    Induction:   PONV Risk Score and Plan: 3 and Propofol infusion, TIVA and Midazolam  Airway Management Planned: Nasal Cannula  Additional Equipment:   Intra-op Plan:   Post-operative Plan:   Informed Consent: I have reviewed the patients History and Physical, chart, labs and discussed the procedure including the risks, benefits and alternatives for the proposed anesthesia with the patient or authorized representative who has indicated his/her understanding and acceptance.     Plan Discussed with:   Anesthesia Plan Comments:         Anesthesia Quick Evaluation

## 2018-05-26 ENCOUNTER — Encounter: Payer: Self-pay | Admitting: Gastroenterology

## 2018-05-26 ENCOUNTER — Ambulatory Visit: Payer: Self-pay | Admitting: General Surgery

## 2018-05-26 LAB — SURGICAL PATHOLOGY

## 2018-05-26 LAB — ANA: Anti Nuclear Antibody(ANA): NEGATIVE

## 2018-06-02 ENCOUNTER — Telehealth: Payer: Self-pay | Admitting: Emergency Medicine

## 2018-06-02 NOTE — Telephone Encounter (Signed)
She needs to see ortho.  She can either go to the [facility], or I can refer her.  If she'd like referral, please place this. Thank you.

## 2018-06-02 NOTE — Telephone Encounter (Signed)
Patient notified. Will call back after she check on [facility] in [place]

## 2018-06-02 NOTE — Telephone Encounter (Signed)
All lab work came back negative. Still having shoulder pain where it feels like it is popping out of place.

## 2018-06-17 ENCOUNTER — Other Ambulatory Visit: Payer: Self-pay

## 2018-06-17 ENCOUNTER — Encounter: Payer: Self-pay | Admitting: Family Medicine

## 2018-06-17 ENCOUNTER — Other Ambulatory Visit (HOSPITAL_COMMUNITY)
Admission: RE | Admit: 2018-06-17 | Discharge: 2018-06-17 | Disposition: A | Discharge status: Home / Self Care | Payer: BLUE CROSS/BLUE SHIELD | Source: Ambulatory Visit | Attending: Family Medicine | Admitting: Family Medicine

## 2018-06-17 ENCOUNTER — Ambulatory Visit
Admission: RE | Admit: 2018-06-17 | Discharge: 2018-06-17 | Disposition: A | Discharge status: Home / Self Care | Payer: BLUE CROSS/BLUE SHIELD | Source: Ambulatory Visit | Attending: Family Medicine | Admitting: Family Medicine

## 2018-06-17 ENCOUNTER — Ambulatory Visit (INDEPENDENT_AMBULATORY_CARE_PROVIDER_SITE_OTHER): Payer: BLUE CROSS/BLUE SHIELD | Admitting: Family Medicine

## 2018-06-17 VITALS — BP 128/72 | HR 82 | Temp 98.4°F | Resp 16 | Ht 65.0 in | Wt 196.2 lb

## 2018-06-17 DIAGNOSIS — Z1239 Encounter for other screening for malignant neoplasm of breast: Secondary | ICD-10-CM | POA: Diagnosis present

## 2018-06-17 DIAGNOSIS — Z113 Encounter for screening for infections with a predominantly sexual mode of transmission: Secondary | ICD-10-CM | POA: Insufficient documentation

## 2018-06-17 DIAGNOSIS — R002 Palpitations: Secondary | ICD-10-CM

## 2018-06-17 DIAGNOSIS — E782 Mixed hyperlipidemia: Secondary | ICD-10-CM | POA: Diagnosis not present

## 2018-06-17 DIAGNOSIS — I4510 Unspecified right bundle-branch block: Secondary | ICD-10-CM

## 2018-06-17 DIAGNOSIS — Z01419 Encounter for gynecological examination (general) (routine) without abnormal findings: Secondary | ICD-10-CM | POA: Diagnosis not present

## 2018-06-17 DIAGNOSIS — Z6832 Body mass index (BMI) 32.0-32.9, adult: Secondary | ICD-10-CM

## 2018-06-17 DIAGNOSIS — N393 Stress incontinence (female) (male): Secondary | ICD-10-CM

## 2018-06-17 DIAGNOSIS — N644 Mastodynia: Secondary | ICD-10-CM | POA: Diagnosis present

## 2018-06-17 DIAGNOSIS — Z1159 Encounter for screening for other viral diseases: Secondary | ICD-10-CM

## 2018-06-17 DIAGNOSIS — E6609 Other obesity due to excess calories: Secondary | ICD-10-CM | POA: Diagnosis not present

## 2018-06-17 DIAGNOSIS — E559 Vitamin D deficiency, unspecified: Secondary | ICD-10-CM

## 2018-06-17 DIAGNOSIS — I10 Essential (primary) hypertension: Secondary | ICD-10-CM

## 2018-06-17 DIAGNOSIS — Z124 Encounter for screening for malignant neoplasm of cervix: Secondary | ICD-10-CM | POA: Insufficient documentation

## 2018-06-17 NOTE — Progress Notes (Addendum)
Name: Amy May   MRN: [id number]    DOB: 02-19-1966   Date:06/17/2018       Progress Note  Subjective  Chief Complaint  Chief Complaint  Patient presents with  . Annual Exam    HPI  Patient presents for annual CPE.  Diet: She says she doesn't eat a lot - skips breakfast, has a small lunch, and will have a small dinner - fruit, soup, etc. Exercise: Not exercising - she has had ongoing body aches, and her knees hurt.  She does have appointment with [facility] today.  USPSTF grade A and B recommendations    Office Visit from 06/17/2018 in [facility]  AUDIT-C Score  0     Depression:  Depression screen [facility] 2/9 06/17/2018 04/30/2018 07/12/2015 03/21/2015  Decreased Interest 0 0 0 0  Down, Depressed, Hopeless 0 0 0 0  PHQ - 2 Score 0 0 0 0  Altered sleeping 0 0 - -  Tired, decreased energy 0 0 - -  Change in appetite 0 0 - -  Feeling bad or failure about yourself  0 0 - -  Trouble concentrating 0 0 - -  Moving slowly or fidgety/restless 0 0 - -  Suicidal thoughts 0 0 - -  PHQ-9 Score 0 0 - -  Difficult doing work/chores Not difficult at all Not difficult at all - -   Hypertension: BP Readings from Last 3 Encounters:  06/17/18 128/72  05/25/18 105/68  04/30/18 118/72   Obesity: Wt Readings from Last 3 Encounters:  06/17/18 196 lb 3.2 oz (89 kg)  05/25/18 194 lb (88 kg)  04/30/18 192 lb 12.8 oz (87.5 kg)   BMI Readings from Last 3 Encounters:  06/17/18 32.65 kg/m  05/25/18 35.48 kg/m  04/30/18 32.08 kg/m    Hep C Screening: We will check today STD testing and prevention (HIV/chl/gon/syphilis): HIV negative 04/30/2018; We will check other testing today; same partner for 10 years. Intimate partner violence: No concerns Sexual History/Pain during Intercourse: No concerns Menstrual History/LMP/Abnormal Bleeding: Postmenopausal; no vaginal bleeding. Incontinence Symptoms: Does have some ongoing stress incontinence - only happens  occasionally.  Advanced Care Planning: A voluntary discussion about advance care planning including the explanation and discussion of advance directives.  Discussed health care proxy and Living will, and the patient was able to identify a health care proxy as Junie Panning and Ruthell Rummage. - her children.  Patient does not have a living will at present time. If patient does have living will, I have requested they bring this to the clinic to be scanned in to their chart.  Breast cancer: Due for mammogram - scheduled for today. No results found for: HMMAMMO  BRCA gene screening: No family history, no personal history Cervical cancer screening: Due today  Osteoporosis Screening: We will check vitamin D level today No results found for: HMDEXASCAN  Lipids: We will recheck today - TGD's were over 400 at last visit, but she was not fasting. Lab Results  Component Value Date   CHOL 175 04/30/2018   CHOL 179 07/12/2015   Lab Results  Component Value Date   HDL 41 (L) 04/30/2018   HDL 54 07/12/2015   Lab Results  Component Value Date   LDLCALC  04/30/2018     Comment:     . LDL cholesterol not calculated. Triglyceride levels greater than 400 mg/dL invalidate calculated LDL results. . Reference range: <100 . Desirable range <100 mg/dL for primary prevention;   <70  mg/dL for patients with CHD or diabetic patients  with > or = 2 CHD risk factors. Marland Kitchen LDL-C is now calculated using the Martin-Hopkins  calculation, which is a validated novel method providing  better accuracy than the Friedewald equation in the  estimation of LDL-C.  Cresenciano Genre et al. Annamaria Helling. 4431;540(08): 2061-2068  (http://education.[facility].com/faq/FAQ164)    LDLCALC 106 (H) 07/12/2015   Lab Results  Component Value Date   TRIG 412 (H) 04/30/2018   TRIG 96 07/12/2015   Lab Results  Component Value Date   CHOLHDL 4.3 04/30/2018   CHOLHDL 3.3 07/12/2015   No results found for: LDLDIRECT  Glucose:  Glucose   Date Value Ref Range Status  07/12/2015 95 65 - 99 mg/dL Final   Glucose, Bld  Date Value Ref Range Status  04/30/2018 115 65 - 139 mg/dL Final    Comment:    .        Non-fasting reference interval .     Skin cancer: No concerning moles or lesions Colorectal cancer: Denies family or personal history of colorectal cancer, no changes in BM's - no blood in stool, dark and tarry stool, mucus in stool, or constipation/diarrhea.   Lung cancer:  Low Dose CT Chest recommended if Age 35-80 years, 30 pack-year currently smoking OR have quit w/in 15years. Patient does not qualify.   ECG: None on file; no chest pain, shortness of breath. Endorses palpitations occasionally, also has HTN - we will check EKG today.  Patient Active Problem List   Diagnosis Date Noted  . Encounter for screening colonoscopy   . Chronic right shoulder pain 04/30/2018  . Body aches 04/30/2018  . Essential hypertension 07/12/2015  . Class 1 obesity due to excess calories with serious comorbidity and body mass index (BMI) of 32.0 to 32.9 in adult 07/12/2015  . Hyperlipidemia 07/12/2015  . Female genuine stress incontinence 11/10/2013  . Acquired cyst of kidney 11/10/2013    Past Surgical History:  Procedure Laterality Date  . COLONOSCOPY WITH PROPOFOL N/A 05/25/2018   Procedure: COLONOSCOPY WITH PROPOFOL;  Surgeon: Lin Landsman, MD;  Location[facility] ENDOSCOPY;  Service: Gastroenterology;  Laterality: N/A;  . DILATION AND CURETTAGE OF UTERUS    . TUBAL LIGATION      Family History  Family history unknown: Yes    Social History   Socioeconomic History  . Marital status: Divorced    Spouse name: Not on file  . Number of children: 2  . Years of education: Not on file  . Highest education level: Not on file  Occupational History  . Occupation: Retail buyer: [employer]  Social Needs  . Financial resource strain: Not hard at all  . Food insecurity:    Worry: Never true    Inability: Never  true  . Transportation needs:    Medical: No    Non-medical: No  Tobacco Use  . Smoking status: Never Smoker  . Smokeless tobacco: Never Used  Substance and Sexual Activity  . Alcohol use: No    Alcohol/week: 0.0 standard drinks  . Drug use: No  . Sexual activity: Not Currently    Partners: Male  Lifestyle  . Physical activity:    Days per week: 1 day    Minutes per session: 30 min  . Stress: Not at all  Relationships  . Social connections:    Talks on phone: More than three times a week    Gets together: More than three times a week  Attends religious service: More than 4 times per year    Active member of club or organization: Yes    Attends meetings of clubs or organizations: More than 4 times per year    Relationship status: Divorced  . Intimate partner violence:    Fear of current or ex partner: No    Emotionally abused: No    Physically abused: No    Forced sexual activity: No  Other Topics Concern  . Not on file  Social History Narrative  . Not on file     Current Outpatient Medications:  .  triamterene-hydrochlorothiazide (MAXZIDE-25) 37.5-25 MG per tablet, Take 1 tablet by mouth daily., Disp: 30 tablet, Rfl: 0  No Known Allergies   ROS  Constitutional: Negative for fever or weight change.  Respiratory: Negative for cough and shortness of breath.   Cardiovascular: Negative for chest pain.  Endorses occasional Palpitaiton  Gastrointestinal: Negative for abdominal pain, no bowel changes.  Musculoskeletal: Negative for gait problem or joint swelling.  Skin: Negative for rash.  Neurological: Negative for dizziness or headache.  No other specific complaints in a complete review of systems (except as listed in HPI above).  Objective  Vitals:   06/17/18 0706  BP: 128/72  Pulse: 82  Resp: 16  Temp: 98.4 F (36.9 C)  TempSrc: Oral  SpO2: 96%  Weight: 196 lb 3.2 oz (89 kg)  Height: 5' 5"  (1.651 m)    Body mass index is 32.65 kg/m.  Physical  Exam Constitutional: Patient appears well-developed and well-nourished. No distress.  HENT: Head: Normocephalic and atraumatic. Ears: B TMs ok, no erythema or effusion; Nose: Nose normal. Mouth/Throat: Oropharynx is clear and moist. No oropharyngeal exudate.  Eyes: Conjunctivae and EOM are normal. Pupils are equal, round, and reactive to light. No scleral icterus.  Neck: Normal range of motion. Neck supple. No JVD present. No thyromegaly present.  Cardiovascular: Normal rate, regular rhythm and normal heart sounds.  No murmur heard. No BLE edema. Pulmonary/Chest: Effort normal and breath sounds normal. No respiratory distress. Abdominal: Soft. Bowel sounds are normal, no distension. There is no tenderness. no masses Breast: no lumps or masses, no nipple discharge or rashes FEMALE GENITALIA:  External genitalia normal External urethra normal Vaginal vault normal without discharge or lesions Cervix normal without discharge or lesions Bimanual exam normal without masses RECTAL: no rectal masses or hemorrhoids Musculoskeletal: Normal range of motion, no joint effusions. No gross deformities Neurological: he is alert and oriented to person, place, and time. No cranial nerve deficit. Coordination, balance, strength, speech and gait are normal.  Skin: Skin is warm and dry. No rash noted. No erythema.  Psychiatric: Patient has a normal mood and affect. behavior is normal. Judgment and thought content normal.   Recent Results (from the past 2160 hour(s))  Prolactin     Status: None   Collection Time: 04/30/18  2:22 PM  Result Value Ref Range   Prolactin 4.3 ng/mL    Comment:             Reference Range  Females         Non-pregnant        3.0-30.0         Pregnant           10.0-209.0         Postmenopausal      2.0-20.0 . . .   Sed Rate (ESR)     Status: None   Collection Time: 04/30/18  2:22 PM  Result Value Ref Range   Sed Rate 19 0 - 30 mm/h  COMPLETE METABOLIC PANEL WITH GFR      Status: None   Collection Time: 04/30/18  2:22 PM  Result Value Ref Range   Glucose, Bld 115 65 - 139 mg/dL    Comment: .        Non-fasting reference interval .    BUN 16 7 - 25 mg/dL   Creat 0.95 0.50 - 1.05 mg/dL    Comment: For patients >1 years of age, the reference limit for Creatinine is approximately 13% higher for people identified as African-American. .    GFR, Est Non African American 68 > OR = 60 mL/min/1.58m   GFR, Est African American 79 > OR = 60 mL/min/1.777m  BUN/Creatinine Ratio NOT APPLICABLE 6 - 22 (calc)   Sodium 140 135 - 146 mmol/L   Potassium 3.9 3.5 - 5.3 mmol/L   Chloride 104 98 - 110 mmol/L   CO2 29 20 - 32 mmol/L   Calcium 9.7 8.6 - 10.4 mg/dL   Total Protein 7.4 6.1 - 8.1 g/dL   Albumin 4.4 3.6 - 5.1 g/dL   Globulin 3.0 1.9 - 3.7 g/dL (calc)   AG Ratio 1.5 1.0 - 2.5 (calc)   Total Bilirubin 0.4 0.2 - 1.2 mg/dL   Alkaline phosphatase (APISO) 78 33 - 130 U/L   AST 21 10 - 35 U/L   ALT 26 6 - 29 U/L  Lipid panel     Status: Abnormal   Collection Time: 04/30/18  2:22 PM  Result Value Ref Range   Cholesterol 175 <200 mg/dL   HDL 41 (L) >50 mg/dL   Triglycerides 412 (H) <150 mg/dL    Comment: . If a non-fasting specimen was collected, consider repeat triglyceride testing on a fasting specimen if clinically indicated.  JaYates Decampt al. J. of Clin. Lipidol. 203546;5:681-275. Marland Kitchen  LDL Cholesterol (Calc)  mg/dL (calc)    Comment: . LDL cholesterol not calculated. Triglyceride levels greater than 400 mg/dL invalidate calculated LDL results. . Reference range: <100 . Desirable range <100 mg/dL for primary prevention;   <70 mg/dL for patients with CHD or diabetic patients  with > or = 2 CHD risk factors. . Marland KitchenDL-C is now calculated using the Martin-Hopkins  calculation, which is a validated novel method providing  better accuracy than the Friedewald equation in the  estimation of LDL-C.  MaCresenciano Genret al. JAAnnamaria Helling201700;174(94 2061-2068   (http://education.[facility].com/faq/FAQ164)    Total CHOL/HDL Ratio 4.3 <5.0 (calc)   Non-HDL Cholesterol (Calc) 134 (H) <130 mg/dL (calc)    Comment: For patients with diabetes plus 1 major ASCVD risk  factor, treating to a non-HDL-C goal of <100 mg/dL  (LDL-C of <70 mg/dL) is considered a therapeutic  option.   HIV Antibody (routine testing w rflx)     Status: None   Collection Time: 04/30/18  2:22 PM  Result Value Ref Range   HIV 1&2 Ab, 4th Generation NON-REACTIVE NON-REACTI    Comment: HIV-1 antigen and HIV-1/HIV-2 antibodies were not detected. There is no laboratory evidence of HIV infection. . Marland KitchenLEASE NOTE: This information has been disclosed to you from records whose confidentiality may be protected by state law.  If your state requires such protection, then the state law prohibits you from making any further disclosure of the information without the specific written consent of the person to whom it pertains, or as otherwise permitted by law. A general authorization for the  release of medical or other information is NOT sufficient for this purpose. . For additional information please refer to http://education.[facility].com/faq/FAQ106 (This link is being provided for informational/ educational purposes only.) . Marland Kitchen The performance of this assay has not been clinically validated in patients less than 68 years old. .   TSH     Status: Abnormal   Collection Time: 04/30/18  2:22 PM  Result Value Ref Range   TSH 0.33 (L) mIU/L    Comment:           Reference Range .           > or = 20 Years  0.40-4.50 .                Pregnancy Ranges           First trimester    0.26-2.66           Second trimester   0.55-2.73           Third trimester    0.43-2.91   Pregnancy, urine POC     Status: None   Collection Time: 05/25/18  7:25 AM  Result Value Ref Range   Preg Test, Ur NEGATIVE NEGATIVE    Comment:        THE SENSITIVITY OF THIS METHODOLOGY IS >24  mIU/mL   Surgical pathology     Status: None   Collection Time: 05/25/18  8:31 AM  Result Value Ref Range   SURGICAL PATHOLOGY      Surgical Pathology CASE: [id number] PATIENT: Verdean Bohannon Surgical Pathology Report     SPECIMEN SUBMITTED: A. Colon polyp, transverse; cbx  CLINICAL HISTORY: None provided  PRE-OPERATIVE DIAGNOSIS: Screening colonoscopy  POST-OPERATIVE DIAGNOSIS: Colon polyp     DIAGNOSIS: A. COLON POLYP, TRANSVERSE; COLD BIOPSY: - TUBULAR ADENOMA. - NEGATIVE FOR HIGH-GRADE DYSPLASIA AND MALIGNANCY.   GROSS DESCRIPTION: A. Labeled: Transverse colon polyp cbx Received: In formalin Tissue fragment(s): 2 Size: 0.3-0.4 cm Description: Pink-tan fragments Entirely submitted in one cassette.    Final Diagnosis performed by Allena Napoleon, MD.   Electronically signed 05/26/2018 11:28:02AM The electronic signature indicates that the named Attending Pathologist has evaluated the specimen  Technical component performed at [facility], [address], [place], [place] [address] Lab: [phone] Dir: Rush Farmer, MD, MMM  Professional component performed at [facility], [facility], [place], [place], [place] [address] Lab: [phone] Dir: Dellia Nims. Rubinas, MD   ANA     Status: None   Collection Time: 05/25/18  9:34 AM  Result Value Ref Range   Anti Nuclear Antibody(ANA) NEGATIVE NEGATIVE    Comment: ANA IFA is a first line screen for detecting the presence of up to approximately 150 autoantibodies in various autoimmune diseases. A negative ANA IFA result suggests an ANA-associated autoimmune disease is not present at this time, but is not definitive. If there is high clinical suspicion for Sjogren's syndrome, testing for anti-SS-A/Ro antibody should be considered. Anti-Jo-1 antibody should be considered for clinically suspected inflammatory myopathies. . AC-0: Negative . International Consensus on ANA  Patterns (https://www.hernandez-brewer.com/) . For additional information, please refer to http://education.[facility].com/faq/FAQ177 (This link is being provided for informational/ educational purposes only.) .     PHQ2/9: Depression screen [facility] 2/9 06/17/2018 04/30/2018 07/12/2015 03/21/2015  Decreased Interest 0 0 0 0  Down, Depressed, Hopeless 0 0 0 0  PHQ - 2 Score 0 0 0 0  Altered sleeping 0 0 - -  Tired, decreased energy 0 0 - -  Change in appetite 0 0 - -  Feeling bad or failure about yourself  0 0 - -  Trouble concentrating 0 0 - -  Moving slowly or fidgety/restless 0 0 - -  Suicidal thoughts 0 0 - -  PHQ-9 Score 0 0 - -  Difficult doing work/chores Not difficult at all Not difficult at all - -    Fall Risk: Fall Risk  06/17/2018 04/30/2018 07/12/2015 03/21/2015  Falls in the past year? 0 No No No  Number falls in past yr: 0 - - -  Injury with Fall? 0 - - -   Assessment & Plan  1. Well woman exam -USPSTF grade A and B recommendations reviewed with patient; age-appropriate recommendations, preventive care, screening tests, etc discussed and encouraged; healthy living encouraged; see AVS for patient education given to patient -Discussed importance of 150 minutes of physical activity weekly, eat two servings of fish weekly, eat one serving of tree nuts ( cashews, pistachios, pecans, almonds.Marland Kitchen) every other day, eat 6 servings of fruit/vegetables daily and drink plenty of water and avoid sweet beverages.  - Cytology - PAP - Hepatitis C antibody - RPR  2. Mixed hyperlipidemia - Lipid panel  3. Class 1 obesity due to excess calories with serious comorbidity and body mass index (BMI) of 32.0 to 32.9 in adult - See above regarding teaching  4. Cervical cancer screening - Cytology - PAP  5. Need for hepatitis C screening test - Hepatitis C antibody  6. Routine screening for STI (sexually transmitted infection) - Cytology - PAP - Hepatitis C antibody -  RPR  7. Female genuine stress incontinence - Kegel exercises discussed in detail.  8. Vitamin D deficiency - Vitamin D (25 hydroxy)  9. Palpitations - EKG 12-Lead  10. Essential hypertension - EKG 12-Lead - COMPLETE METABOLIC PANEL WITH GFR - Ambulatory referral to Cardiology  11. Right bundle branch block - COMPLETE METABOLIC PANEL WITH GFR - Magnesium - Ambulatory referral to Cardiology - RBB with borderline wide QRS complex on EKG - discussed with Dr. Ancil Boozer who agrees with plan to send to cardiology and change electrolytes. Pt is well-appearing today without palpitations or chest pain.

## 2018-06-17 NOTE — Addendum Note (Signed)
Addended by: Raelyn Ensign E on: 06/17/2018 11:19 AM   Modules accepted: Orders

## 2018-06-17 NOTE — Patient Instructions (Addendum)
Preventive Care 40-64 Years, Female Preventive care refers to lifestyle choices and visits with your health care provider that can promote health and wellness. What does preventive care include?  A yearly physical exam. This is also called an annual well check.  Dental exams once or twice a year.  Routine eye exams. Ask your health care provider how often you should have your eyes checked.  Personal lifestyle choices, including: ? Daily care of your teeth and gums. ? Regular physical activity. ? Eating a healthy diet. ? Avoiding tobacco and drug use. ? Limiting alcohol use. ? Practicing safe sex. ? Taking low-dose aspirin daily starting at age 62. ? Taking vitamin and mineral supplements as recommended by your health care provider. What happens during an annual well check? The services and screenings done by your health care provider during your annual well check will depend on your age, overall health, lifestyle risk factors, and family history of disease. Counseling Your health care provider may ask you questions about your:  Alcohol use.  Tobacco use.  Drug use.  Emotional well-being.  Home and relationship well-being.  Sexual activity.  Eating habits.  Work and work Statistician.  Method of birth control.  Menstrual cycle.  Pregnancy history.  Screening You may have the following tests or measurements:  Height, weight, and BMI.  Blood pressure.  Lipid and cholesterol levels. These may be checked every 5 years, or more frequently if you are over 72 years old.  Skin check.  Lung cancer screening. You may have this screening every year starting at age 29 if you have a 30-pack-year history of smoking and currently smoke or have quit within the past 15 years.  Fecal occult blood test (FOBT) of the stool. You may have this test every year starting at age 30.  Flexible sigmoidoscopy or colonoscopy. You may have a sigmoidoscopy every 5 years or a colonoscopy  every 10 years starting at age 29.  Hepatitis C blood test.  Hepatitis B blood test.  Sexually transmitted disease (STD) testing.  Diabetes screening. This is done by checking your blood sugar (glucose) after you have not eaten for a while (fasting). You may have this done every 1-3 years.  Mammogram. This may be done every 1-2 years. Talk to your health care provider about when you should start having regular mammograms. This may depend on whether you have a family history of breast cancer.  BRCA-related cancer screening. This may be done if you have a family history of breast, ovarian, tubal, or peritoneal cancers.  Pelvic exam and Pap test. This may be done every 3 years starting at age 38. Starting at age 4, this may be done every 5 years if you have a Pap test in combination with an HPV test.  Bone density scan. This is done to screen for osteoporosis. You may have this scan if you are at high risk for osteoporosis.  Discuss your test results, treatment options, and if necessary, the need for more tests with your health care provider. Vaccines Your health care provider may recommend certain vaccines, such as:  Influenza vaccine. This is recommended every year.  Tetanus, diphtheria, and acellular pertussis (Tdap, Td) vaccine. You may need a Td booster every 10 years.  Varicella vaccine. You may need this if you have not been vaccinated.  Zoster vaccine. You may need this after age 13.  Measles, mumps, and rubella (MMR) vaccine. You may need at least one dose of MMR if you were born in  1958 or later. You may also need a second dose.  Pneumococcal 13-valent conjugate (PCV13) vaccine. You may need this if you have certain conditions and were not previously vaccinated.  Pneumococcal polysaccharide (PPSV23) vaccine. You may need one or two doses if you smoke cigarettes or if you have certain conditions.  Meningococcal vaccine. You may need this if you have certain  conditions.  Hepatitis A vaccine. You may need this if you have certain conditions or if you travel or work in places where you may be exposed to hepatitis A.  Hepatitis B vaccine. You may need this if you have certain conditions or if you travel or work in places where you may be exposed to hepatitis B.  Haemophilus influenzae type b (Hib) vaccine. You may need this if you have certain conditions.  Talk to your health care provider about which screenings and vaccines you need and how often you need them. This information is not intended to replace advice given to you by your health care provider. Make sure you discuss any questions you have with your health care provider. Document Released: 07/28/2015 Document Revised: 03/20/2016 Document Reviewed: 05/02/2015 Elsevier Interactive Patient Education  2018 [place] are compounds that affect the level of uric acid in your body. A low-purine diet is a diet that is low in purines. Eating a low-purine diet can prevent the level of uric acid in your body from getting too high and causing gout or kidney stones or both. What do I need to know about this diet?  Choose low-purine foods. Examples of low-purine foods are listed in the next section.  Drink plenty of fluids, especially water. Fluids can help remove uric acid from your body. Try to drink 8-16 cups (1.9-3.8 L) a day.  Limit foods high in fat, especially saturated fat, as fat makes it harder for the body to get rid of uric acid. Foods high in saturated fat include pizza, cheese, ice cream, whole milk, fried foods, and gravies. Choose foods that are lower in fat and lean sources of protein. Use olive oil when cooking as it contains healthy fats that are not high in saturated fat.  Limit alcohol. Alcohol interferes with the elimination of uric acid from your body. If you are having a gout attack, avoid all alcohol.  Keep in mind that different people's bodies react  differently to different foods. You will probably learn over time which foods do or do not affect you. If you discover that a food tends to cause your gout to flare up, avoid eating that food. You can more freely enjoy foods that do not cause problems. If you have any questions about a food item, talk to your dietitian or health care provider. Which foods are low, moderate, and high in purines? The following is a list of foods that are low, moderate, and high in purines. You can eat any amount of the foods that are low in purines. You may be able to have small amounts of foods that are moderate in purines. Ask your health care provider how much of a food moderate in purines you can have. Avoid foods high in purines. Grains  Foods low in purines: Enriched white bread, pasta, rice, cake, cornbread, popcorn.  Foods moderate in purines: Whole-grain breads and cereals, wheat germ, bran, oatmeal. Uncooked oatmeal. Dry wheat bran or wheat germ.  Foods high in purines: Pancakes, [place] toast, biscuits, muffins. Vegetables  Foods low in purines: All vegetables, except those that  are moderate in purines.  Foods moderate in purines: Asparagus, cauliflower, spinach, mushrooms, green peas. Fruits  All fruits are low in purines. Meats and other Protein Foods  Foods low in purines: Eggs, nuts, peanut butter.  Foods moderate in purines: 80-90% lean beef, lamb, veal, pork, poultry, fish, eggs, peanut butter, nuts. Crab, lobster, oysters, and shrimp. Cooked dried beans, peas, and lentils.  Foods high in purines: Anchovies, sardines, herring, mussels, tuna, codfish, scallops, trout, and haddock. [name]. Organ meats (such as liver or kidney). [place]. Game meat. Goose. Sweetbreads. Dairy  All dairy foods are low in purines. Low-fat and fat-free dairy products are best because they are low in saturated fat. Beverages  Drinks low in purines: Water, carbonated beverages, tea, coffee, cocoa.  Drinks moderate in  purines: Soft drinks and other drinks sweetened with high-fructose corn syrup. Juices. To find whether a food or drink is sweetened with high-fructose corn syrup, look at the ingredients list.  Drinks high in purines: Alcoholic beverages (such as beer). Condiments  Foods low in purines: Salt, herbs, olives, pickles, relishes, vinegar.  Foods moderate in purines: Butter, margarine, oils, mayonnaise. Fats and Oils  Foods low in purines: All types, except gravies and sauces made with meat.  Foods high in purines: Gravies and sauces made with meat. Other Foods  Foods low in purines: Sugars, sweets, gelatin. Cake. Soups made without meat.  Foods moderate in purines: Meat-based or fish-based soups, broths, or bouillons. Foods and drinks sweetened with high-fructose corn syrup.  Foods high in purines: High-fat desserts (such as ice cream, cookies, cakes, pies, doughnuts, and chocolate). Contact your dietitian for more information on foods that are not listed here. This information is not intended to replace advice given to you by your health care provider. Make sure you discuss any questions you have with your health care provider. Document Released: 10/26/2010 Document Revised: 12/07/2015 Document Reviewed: 06/07/2013 Elsevier Interactive Patient Education  2017 [place].   Kegel Exercises Kegel exercises help strengthen the muscles that support the rectum, vagina, small intestine, bladder, and uterus. Doing Kegel exercises can help:  Improve bladder and bowel control.  Improve sexual response.  Reduce problems and discomfort during pregnancy.  Kegel exercises involve squeezing your pelvic floor muscles, which are the same muscles you squeeze when you try to stop the flow of urine. The exercises can be done while sitting, standing, or lying down, but it is best to vary your position. Phase 1 exercises 1. Squeeze your pelvic floor muscles tight. You should feel a tight lift in your  rectal area. If you are a female, you should also feel a tightness in your vaginal area. Keep your stomach, buttocks, and legs relaxed. 2. Hold the muscles tight for up to 10 seconds. 3. Relax your muscles. Repeat this exercise 50 times a day or as many times as told by your health care provider. Continue to do this exercise for at least 4-6 weeks or for as long as told by your health care provider. This information is not intended to replace advice given to you by your health care provider. Make sure you discuss any questions you have with your health care provider. Document Released: 06/17/2012 Document Revised: 02/24/2016 Document Reviewed: 05/21/2015 Elsevier Interactive Patient Education  [employer].

## 2018-06-18 LAB — LIPID PANEL
Cholesterol: 182 mg/dL (ref <200)
HDL: 51 mg/dL (ref >50)
LDL CHOLESTEROL (CALC): 110 mg/dL — AB
NON-HDL CHOLESTEROL (CALC): 131 mg/dL — AB (ref <130)
TRIGLYCERIDES: 107 mg/dL (ref <150)
Total CHOL/HDL Ratio: 3.6 (calc) (ref <5.0)

## 2018-06-18 LAB — THYROID PANEL WITH TSH
Free Thyroxine Index: 3 (ref 1.4–3.8)
T3 Uptake: 31 % (ref 22–35)
T4, Total: 9.8 ug/dL (ref 5.1–11.9)
TSH: 0.5 mIU/L

## 2018-06-18 LAB — CYTOLOGY - PAP
Chlamydia: NEGATIVE
DIAGNOSIS: NEGATIVE
HPV: NOT DETECTED
NEISSERIA GONORRHEA: NEGATIVE

## 2018-06-18 LAB — COMPLETE METABOLIC PANEL WITH GFR
AG Ratio: 1.4 (calc) (ref 1.0–2.5)
ALBUMIN MSPROF: 4.3 g/dL (ref 3.6–5.1)
ALKALINE PHOSPHATASE (APISO): 79 U/L (ref 33–130)
ALT: 28 U/L (ref 6–29)
AST: 20 U/L (ref 10–35)
BILIRUBIN TOTAL: 0.4 mg/dL (ref 0.2–1.2)
BUN: 15 mg/dL (ref 7–25)
CHLORIDE: 102 mmol/L (ref 98–110)
CO2: 30 mmol/L (ref 20–32)
Calcium: 9.9 mg/dL (ref 8.6–10.4)
Creat: 0.86 mg/dL (ref 0.50–1.05)
GFR, EST AFRICAN AMERICAN: 89 mL/min/{1.73_m2} (ref >=60)
GFR, Est Non African American: 77 mL/min/{1.73_m2} (ref >=60)
Globulin: 3 g/dL (calc) (ref 1.9–3.7)
Glucose, Bld: 88 mg/dL (ref 65–99)
Potassium: 3.8 mmol/L (ref 3.5–5.3)
SODIUM: 141 mmol/L (ref 135–146)
TOTAL PROTEIN: 7.3 g/dL (ref 6.1–8.1)

## 2018-06-18 LAB — MAGNESIUM: MAGNESIUM: 2.1 mg/dL (ref 1.5–2.5)

## 2018-06-18 LAB — HEPATITIS C ANTIBODY
HEP C AB: NONREACTIVE
SIGNAL TO CUT-OFF: 0.02 (ref <1.00)

## 2018-06-18 LAB — RPR: RPR Ser Ql: NONREACTIVE

## 2018-06-18 LAB — VITAMIN D 25 HYDROXY (VIT D DEFICIENCY, FRACTURES): VIT D 25 HYDROXY: 24 ng/mL — AB (ref 30–100)

## 2018-07-05 DIAGNOSIS — R002 Palpitations: Secondary | ICD-10-CM | POA: Insufficient documentation

## 2018-07-05 DIAGNOSIS — R9431 Abnormal electrocardiogram [ECG] [EKG]: Secondary | ICD-10-CM | POA: Insufficient documentation

## 2018-07-05 NOTE — Progress Notes (Signed)
Cardiology Office Note  Date:  07/06/2018   ID:  Amy May, DOB 10-Feb-1966, MRN [id number]  PCP:  Hubbard Hartshorn, FNP   Chief Complaint  Patient presents with  . New Patient (Initial Visit)    palpatations comes and goes feels like a flutter has only happened once since 06/17/2018. Coughs and it resolves. Medications reviewed verbally.    HPI:  Amy May is a 52 year old woman with past medical history of  hypertension Gout Referred by Raelyn Ensign for consultation of her Abnormal EKG/RBBB, palpitations, HTN  She reports having tachycardia/palpitations presenting approximately 1 year ago Symptoms seem to be getting more notable, Describes it as a rapid fluttering lasting for several seconds or less Often will lose her breath, makes her cough Happened once from 12/4 to today Does not happen on a regular basis, no precipitating triggers  Otherwise very active at baseline, no regular exercise program  She does drink several cups of coffee per day but has been doing so for many years  EKG personally reviewed by myself on todays visit Shows normal sinus rhythm with rate 72 bpm right bundle branch block   PMH:   has a past medical history of Allergy and Hypertension.  PSH:    Past Surgical History:  Procedure Laterality Date  . COLONOSCOPY WITH PROPOFOL N/A 05/25/2018   Procedure: COLONOSCOPY WITH PROPOFOL;  Surgeon: Lin Landsman, MD;  Location: [facility] ENDOSCOPY;  Service: Gastroenterology;  Laterality: N/A;  . DILATION AND CURETTAGE OF UTERUS    . TUBAL LIGATION      Current Outpatient Medications  Medication Sig Dispense Refill  . allopurinol (ZYLOPRIM) 100 MG tablet Take 100 mg by mouth as needed. For gout flare    . meloxicam (MOBIC) 15 MG tablet meloxicam 15 mg tablet  TAKE 1 TABLET BY MOUTH EVERY DAY    . methocarbamol (ROBAXIN) 750 MG tablet methocarbamol 750 mg tablet  TAKE 1 TABLET BY MOUTH THREE TIMES A DAY    .  triamterene-hydrochlorothiazide (MAXZIDE-25) 37.5-25 MG per tablet Take 1 tablet by mouth daily. 30 tablet 0   No current facility-administered medications for this visit.      Allergies:   Patient has no known allergies.   Social History:  The patient  reports that she has never smoked. She has never used smokeless tobacco. She reports that she does not drink alcohol or use drugs.   Family History:   family history includes Heart disease in her maternal aunt.    Review of Systems: ROS   PHYSICAL EXAM: VS:  BP 128/78 (BP Location: Right Arm, Patient Position: Sitting, Cuff Size: Normal)   Pulse 72   Ht 5\' 2"  (1.575 m)   Wt 197 lb (89.4 kg)   BMI 36.03 kg/m  , BMI Body mass index is 36.03 kg/m. GEN: Well nourished, well developed, in no acute distress  HEENT: normal  Neck: no JVD, carotid bruits, or masses Cardiac: RRR; no murmurs, rubs, or gallops,no edema  Respiratory:  clear to auscultation bilaterally, normal work of breathing GI: soft, nontender, nondistended, + BS MS: no deformity or atrophy  Skin: warm and dry, no rash Neuro:  Strength and sensation are intact Psych: euthymic mood, full affect    Recent Labs: 06/17/2018: ALT 28; BUN 15; Creat 0.86; Magnesium 2.1; Potassium 3.8; Sodium 141; TSH 0.50    Lipid Panel Lab Results  Component Value Date   CHOL 182 06/17/2018   HDL 51 06/17/2018   LDLCALC 110 (H) 06/17/2018  TRIG 107 06/17/2018      Wt Readings from Last 3 Encounters:  07/06/18 197 lb (89.4 kg)  06/17/18 196 lb 3.2 oz (89 kg)  05/25/18 194 lb (88 kg)       ASSESSMENT AND PLAN:  Paroxysmal tachycardia (HCC) Concerning for either short run of atrial tachycardia or SVT Each of these arrhythmias was discussed with her in detail Very rare episodes, she does not want medications at this time Would recommend beta-blockers if symptoms get worse We did discuss long-term event monitor, suggested she call our office if she has increasing  frequency intensity or duration For now we will continue to monitor Less likely secondary to coffee or other stimulants  Abnormal EKG Right bundle branch block discussed with her Benign finding, otherwise asymptomatic  Essential hypertension Blood pressure is well controlled on today's visit. No changes made to the medications.  Mixed hyperlipidemia Numbers relatively well controlled on no medication No new medications started   Total encounter time more than 60 minutes  Greater than 50% was spent in counseling and coordination of care with the patient  Patient was seen in consultation for Raelyn Ensign and will be referred back to her office for ongoing care of the issues detailed above  Disposition:   F/U as needed   Total encounter time more than 60 minutes  Greater than 50% was spent in counseling and coordination of care with the patient   No orders of the defined types were placed in this encounter.    Signed, Esmond Plants, M.D., Ph.D. 07/06/2018  [place], [place]

## 2018-07-06 ENCOUNTER — Ambulatory Visit: Payer: BLUE CROSS/BLUE SHIELD | Admitting: Cardiovascular Disease

## 2018-07-06 ENCOUNTER — Encounter: Payer: Self-pay | Admitting: Cardiovascular Disease

## 2018-07-06 VITALS — BP 128/78 | HR 72 | Ht 62.0 in | Wt 197.0 lb

## 2018-07-06 DIAGNOSIS — E782 Mixed hyperlipidemia: Secondary | ICD-10-CM | POA: Diagnosis not present

## 2018-07-06 DIAGNOSIS — R9431 Abnormal electrocardiogram [ECG] [EKG]: Secondary | ICD-10-CM | POA: Diagnosis not present

## 2018-07-06 DIAGNOSIS — I10 Essential (primary) hypertension: Secondary | ICD-10-CM | POA: Diagnosis not present

## 2018-07-06 DIAGNOSIS — R002 Palpitations: Secondary | ICD-10-CM

## 2018-07-06 DIAGNOSIS — I479 Paroxysmal tachycardia, unspecified: Secondary | ICD-10-CM | POA: Diagnosis not present

## 2018-07-06 NOTE — Patient Instructions (Signed)
Read about atrial tachycardia and supraventricular tachycardia  Read about valsalva maneuver and carotid sinus massage  We have a 2 week monitor if symptoms get worse, just call if you would like the monitor  Medication that can be used for rhythm control are B-blockers, Ca channel blockers   Medication Instructions:  No changes  If you need a refill on your cardiac medications before your next appointment, please call your pharmacy.    Lab work: No new labs needed   If you have labs (blood work) drawn today and your tests are completely normal, you will receive your results only by: Marland Kitchen MyChart Message (if you have MyChart) OR . A paper copy in the mail If you have any lab test that is abnormal or we need to change your treatment, we will call you to review the results.   Testing/Procedures: No new testing needed   Follow-Up: At [facility], you and your health needs are our priority.  As part of our continuing mission to provide you with exceptional heart care, we have created designated Provider Care Teams.  These Care Teams include your primary Cardiologist (physician) and Advanced Practice Providers (APPs -  Physician Assistants and Nurse Practitioners) who all work together to provide you with the care you need, when you need it.  . You will need a follow up appointment as needed  . Providers on your designated Care Team:   . Murray Hodgkins, NP . Christell Faith, PA-C . Marrianne Mood, PA-C  Any Other Special Instructions Will Be Listed Below (If Applicable).  For educational health videos Log in to : [email or web address] Or : [email or web address], password : [email or web address]

## 2018-07-07 NOTE — Addendum Note (Signed)
Addended by: Alba Destine on: 07/07/2018 11:51 AM   Modules accepted: Orders

## 2018-07-14 ENCOUNTER — Encounter: Payer: Self-pay | Admitting: Family Medicine

## 2018-07-14 ENCOUNTER — Encounter: Payer: BLUE CROSS/BLUE SHIELD | Admitting: Family Medicine

## 2018-07-14 DIAGNOSIS — R7989 Other specified abnormal findings of blood chemistry: Secondary | ICD-10-CM | POA: Insufficient documentation

## 2018-07-21 ENCOUNTER — Encounter: Payer: Self-pay | Admitting: *Deleted

## 2018-08-17 ENCOUNTER — Ambulatory Visit: Payer: BLUE CROSS/BLUE SHIELD | Admitting: Family Medicine

## 2018-10-07 ENCOUNTER — Telehealth (INDEPENDENT_AMBULATORY_CARE_PROVIDER_SITE_OTHER): Payer: BLUE CROSS/BLUE SHIELD | Admitting: Family Medicine

## 2018-10-07 ENCOUNTER — Other Ambulatory Visit: Payer: Self-pay

## 2018-10-07 DIAGNOSIS — J0100 Acute maxillary sinusitis, unspecified: Secondary | ICD-10-CM | POA: Diagnosis not present

## 2018-10-07 DIAGNOSIS — J302 Other seasonal allergic rhinitis: Secondary | ICD-10-CM

## 2018-10-07 MED ORDER — LEVOCETIRIZINE DIHYDROCHLORIDE 5 MG PO TABS: 5.0000 mg | ORAL_TABLET | Freq: Every evening | ORAL | 1 refills | Status: DC

## 2018-10-07 MED ORDER — DOXYCYCLINE HYCLATE 100 MG PO TABS: 100.0000 mg | ORAL_TABLET | Freq: Two times a day (BID) | ORAL | 0 refills | Status: AC

## 2018-10-07 MED ORDER — FLUTICASONE PROPIONATE 50 MCG/ACT NA SUSP: 2.0000 | Freq: Every day | NASAL | 1 refills | Status: DC

## 2018-10-07 NOTE — Progress Notes (Signed)
Virtual Visit via Video Note  I connected with Amy May on 10/07/18 at  8:20 AM EDT by a video enabled telemedicine application and verified that I am speaking with the correct person using two identifiers.  Patient is at home and I am in office for the visit.  This telehealth visit is performed due to [place].   I discussed the limitations of evaluation and management by telemedicine and the availability of in person appointments. The patient expressed understanding and agreed to proceed.  History of Present Illness:  Pt calls in to discuss nasal congestion/sinus pain and pressure, and allergies for about 8-9 days. She deals with annual allergies.  She is having PND, nasal congestion, now having sinus pain and pressure, and sinus headaches, sneezing, and some dry coughing.  She denies fevers/chills (having temp checked daily at work), no body aches, denies chest pain, shortness of breath, body aches, nausea/vomiting/diarrhea.  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  She has been taking benadryl with only some relief.   Does not want to take intranasal steroid unless she absolutely has to.  She has checked her BP at home about 2 weeks ago - running 117/87.   Observations/Objective:  She reports no frontal sinus tenderness, bilateral maxillary sinuses are tender per her report.  She is pleasant, speaking in complete sentences, does not seem to be in any distress. A&Ox4.  Assessment and Plan:  1. Acute non-recurrent maxillary sinusitis - Suspect sinusitis based on symptom description and length of time of illness. - doxycycline (VIBRA-TABS) 100 MG tablet; Take 1 tablet (100 mg total) by mouth 2 (two) times daily for 7 days.  Dispense: 14 tablet; Refill: 0  2. Seasonal allergic rhinitis, unspecified trigger - Discussed use of flonase in detail and patient will trial this along with Xyzal. - levocetirizine (XYZAL) 5 MG tablet; Take  1 tablet (5 mg total) by mouth every evening.  Dispense: 90 tablet; Refill: 1 - fluticasone (FLONASE) 50 MCG/ACT nasal spray; Place 2 sprays into both nostrils daily.  Dispense: 48 g; Refill: 1  Follow Up Instructions:  I discussed the assessment and treatment plan with the patient. The patient was provided an opportunity to ask questions and all were answered. The patient agreed with the plan and demonstrated an understanding of the instructions.   The patient was advised to call back or seek an in-person evaluation if the symptoms worsen or if the condition fails to improve as anticipated.  I provided 13 minutes of non-face-to-face time during this encounter.  June Follow Up (6 month follow up)  Hubbard Hartshorn, FNP

## 2018-10-07 NOTE — Patient Instructions (Signed)
Sinusitis, nasal drainage, cough, ear pain for 5 days

## 2019-03-10 ENCOUNTER — Encounter: Payer: Self-pay | Admitting: Family Medicine

## 2019-03-10 ENCOUNTER — Other Ambulatory Visit: Payer: Self-pay

## 2019-03-10 ENCOUNTER — Ambulatory Visit (INDEPENDENT_AMBULATORY_CARE_PROVIDER_SITE_OTHER): Payer: BLUE CROSS/BLUE SHIELD | Admitting: Family Medicine

## 2019-03-10 DIAGNOSIS — J302 Other seasonal allergic rhinitis: Secondary | ICD-10-CM

## 2019-03-10 MED ORDER — LEVOCETIRIZINE DIHYDROCHLORIDE 5 MG PO TABS: 5.0000 mg | ORAL_TABLET | Freq: Every day | ORAL | 1 refills | Status: DC

## 2019-03-10 MED ORDER — AZELASTINE HCL 0.1 % NA SOLN: 1.0000 | Freq: Two times a day (BID) | NASAL | 6 refills | Status: DC | PRN

## 2019-03-10 MED ORDER — FLUTICASONE PROPIONATE 50 MCG/ACT NA SUSP: 2.0000 | Freq: Every day | NASAL | 1 refills | Status: DC

## 2019-03-10 NOTE — Progress Notes (Signed)
Name: Amy May   MRN: [id number]    DOB: 1966/05/18   Date:03/10/2019       Progress Note  Subjective  Chief Complaint  Chief Complaint  Patient presents with  . Allergic Rhinitis     I connected with  Amy May on 03/10/19 at 10:00 AM EDT by telephone and verified that I am speaking with the correct person using two identifiers.   I discussed the limitations, risks, security and privacy concerns of performing an evaluation and management service by telephone and the availability of in person appointments. Staff also discussed with the patient that there may be a patient responsible charge related to this service. Patient Location: Home Provider Location: Home Additional Individuals present: None  HPI  Pt presents with concern for allergies.  She went on a hiking trip and forgot her Xyzal at the time - took some Zyrtec instead and it worked well for the weekend, she is back to taking Xyzal now that she is home.  She is still having post nasal drainage and a lot of sinus pressure.   Patient Active Problem List   Diagnosis Date Noted  . Low vitamin D level 07/14/2018  . Abnormal TSH 07/14/2018  . Paroxysmal tachycardia ([place]) 07/06/2018  . Abnormal EKG 07/05/2018  . Palpitations 07/05/2018  . Encounter for screening colonoscopy   . Chronic right shoulder pain 04/30/2018  . Body aches 04/30/2018  . Essential hypertension 07/12/2015  . Class 1 obesity due to excess calories with serious comorbidity and body mass index (BMI) of 32.0 to 32.9 in adult 07/12/2015  . Hyperlipidemia 07/12/2015  . Female genuine stress incontinence 11/10/2013  . Acquired cyst of kidney 11/10/2013    Social History   Tobacco Use  . Smoking status: Never Smoker  . Smokeless tobacco: Never Used  Substance Use Topics  . Alcohol use: No    Alcohol/week: 0.0 standard drinks     Current Outpatient Medications:  .  fluticasone (FLONASE) 50 MCG/ACT nasal spray, Place 2 sprays into both  nostrils daily., Disp: 48 g, Rfl: 1 .  levocetirizine (XYZAL) 5 MG tablet, Take 1 tablet (5 mg total) by mouth every evening., Disp: 90 tablet, Rfl: 1 .  triamterene-hydrochlorothiazide (MAXZIDE-25) 37.5-25 MG per tablet, Take 1 tablet by mouth daily., Disp: 30 tablet, Rfl: 0 .  allopurinol (ZYLOPRIM) 100 MG tablet, Take 100 mg by mouth as needed. For gout flare, Disp: , Rfl:  .  meloxicam (MOBIC) 15 MG tablet, meloxicam 15 mg tablet  TAKE 1 TABLET BY MOUTH EVERY DAY, Disp: , Rfl:  .  methocarbamol (ROBAXIN) 750 MG tablet, methocarbamol 750 mg tablet  TAKE 1 TABLET BY MOUTH THREE TIMES A DAY, Disp: , Rfl:   No Known Allergies  I personally reviewed active problem list, medication list, allergies, notes from last encounter, lab results with the patient/caregiver today.  ROS  Constitutional: Negative for fever or weight change.  Respiratory: Negative for cough and shortness of breath.   Cardiovascular: Negative for chest pain or palpitations.  Gastrointestinal: Negative for abdominal pain, no bowel changes.  Musculoskeletal: Negative for gait problem or joint swelling.  Skin: Negative for rash.  Neurological: Negative for dizziness or headache.  No other specific complaints in a complete review of systems (except as listed in HPI above).  Objective  Virtual encounter, vitals not obtained.  There is no height or weight on file to calculate BMI.  Nursing Note and Vital Signs reviewed.  Physical Exam  HENT: Hoarse  Voice. Pulmonary/Chest: Effort normal. No respiratory distress. Speaking in complete sentences Neurological: Pt is alert and oriented to person, place, and time. Speech is normal. Psychiatric: Patient has a normal mood and affect. behavior is normal. Judgment and thought content normal.  No results found for this or any previous visit (from the past 72 hour(s)).  Assessment & Plan  1. Seasonal allergic rhinitis, unspecified trigger - May consider adding Claritin and/or  Singulair temporarily during fall allergy season if not improving in 2-3 weeks. - levocetirizine (XYZAL) 5 MG tablet; Take 1 tablet (5 mg total) by mouth daily.  Dispense: 90 tablet; Refill: 1 - fluticasone (FLONASE) 50 MCG/ACT nasal spray; Place 2 sprays into both nostrils daily.  Dispense: 48 g; Refill: 1 - azelastine (ASTELIN) 0.1 % nasal spray; Place 1 spray into both nostrils 2 (two) times daily as needed for rhinitis or allergies. Use in each nostril as directed  Dispense: 30 mL; Refill: 6  -Red flags and when to present for emergency care or RTC including fever >101.66F, chest pain, shortness of breath, new/worsening/un-resolving symptoms,  reviewed with patient at time of visit. Follow up and care instructions discussed and provided in AVS. - I discussed the assessment and treatment plan with the patient. The patient was provided an opportunity to ask questions and all were answered. The patient agreed with the plan and demonstrated an understanding of the instructions.  - The patient was advised to call back or seek an in-person evaluation if the symptoms worsen or if the condition fails to improve as anticipated.  I provided 10 minutes of non-face-to-face time during this encounter.  Hubbard Hartshorn, FNP

## 2019-03-23 ENCOUNTER — Telehealth: Payer: Self-pay | Admitting: Emergency Medicine

## 2019-03-23 MED ORDER — TRIAMTERENE-HCTZ 37.5-25 MG PO TABS: 1.0000 | ORAL_TABLET | Freq: Every day | ORAL | 0 refills | Status: DC

## 2019-03-23 NOTE — Telephone Encounter (Signed)
Please schedule patient for follow up in the next 30 days.  

## 2019-03-24 ENCOUNTER — Other Ambulatory Visit: Payer: Self-pay

## 2019-03-24 ENCOUNTER — Ambulatory Visit (INDEPENDENT_AMBULATORY_CARE_PROVIDER_SITE_OTHER): Payer: BLUE CROSS/BLUE SHIELD | Admitting: Emergency Medicine

## 2019-03-24 DIAGNOSIS — Z23 Encounter for immunization: Secondary | ICD-10-CM

## 2019-03-24 NOTE — Telephone Encounter (Signed)
lvm informing pt that prescription has been sent to pharmacy and for her to schedule appt

## 2019-04-18 ENCOUNTER — Other Ambulatory Visit: Payer: Self-pay | Admitting: Family Medicine

## 2019-04-18 NOTE — Telephone Encounter (Signed)
Requested medication (s) are due for refill today:  Yes  Requested medication (s) are on the active medication list:   Yes  Future visit scheduled:   No    Last ordered: 03/23/2019  #30  0 refills.   Returned because had 30 day courtesy refill done.   Needs OV per note for further refills.   Requested Prescriptions  Pending Prescriptions Disp Refills   triamterene-hydrochlorothiazide (MAXZIDE-25) 37.5-25 MG tablet [Pharmacy Med Name: TRIAMTERENE-HCTZ 37.5-25 MG TB] 30 tablet 0    Sig: TAKE 1 TABLET BY MOUTH EVERY DAY     Cardiovascular: Diuretic Combos Passed - 04/18/2019  2:32 PM      Passed - K in normal range and within 360 days    Potassium  Date Value Ref Range Status  06/17/2018 3.8 3.5 - 5.3 mmol/L Final         Passed - Na in normal range and within 360 days    Sodium  Date Value Ref Range Status  06/17/2018 141 135 - 146 mmol/L Final  07/12/2015 140 134 - 144 mmol/L Final         Passed - Cr in normal range and within 360 days    Creat  Date Value Ref Range Status  06/17/2018 0.86 0.50 - 1.05 mg/dL Final    Comment:    For patients >24 years of age, the reference limit for Creatinine is approximately 13% higher for people identified as African-American. .          Passed - Ca in normal range and within 360 days    Calcium  Date Value Ref Range Status  06/17/2018 9.9 8.6 - 10.4 mg/dL Final         Passed - Last BP in normal range    BP Readings from Last 1 Encounters:  07/06/18 128/78         Passed - Valid encounter within last 6 months    Recent Outpatient Visits          1 month ago Seasonal allergic rhinitis, unspecified trigger   [place], FNP   6 months ago Acute non-recurrent maxillary sinusitis   [place], [place], FNP   10 months ago Well woman exam   [place], FNP   11 months ago Essential hypertension   [place], FNP   3 years ago Essential hypertension   [facility] Ashok Norris, MD

## 2019-04-20 ENCOUNTER — Other Ambulatory Visit: Payer: Self-pay | Admitting: Emergency Medicine

## 2019-04-20 MED ORDER — TRIAMTERENE-HCTZ 37.5-25 MG PO TABS: 1.0000 | ORAL_TABLET | Freq: Every day | ORAL | 0 refills | Status: DC

## 2019-05-04 ENCOUNTER — Other Ambulatory Visit: Payer: Self-pay | Admitting: Family Medicine

## 2019-05-04 NOTE — Telephone Encounter (Signed)
Requested medication (s) are due for refill today: yes  Requested medication (s) are on the active medication list: yes  Last refill:  04/20/2019  Future visit scheduled: no  Notes to clinic:  Requesting a 90 day supply   Requested Prescriptions  Pending Prescriptions Disp Refills   triamterene-hydrochlorothiazide (MAXZIDE-25) 37.5-25 MG tablet [Pharmacy Med Name: TRIAMTERENE-HCTZ 37.5-25 MG TB] 90 tablet 1    Sig: TAKE 1 TABLET BY MOUTH EVERY DAY     Cardiovascular: Diuretic Combos Passed - 05/04/2019 11:39 AM      Passed - K in normal range and within 360 days    Potassium  Date Value Ref Range Status  06/17/2018 3.8 3.5 - 5.3 mmol/L Final         Passed - Na in normal range and within 360 days    Sodium  Date Value Ref Range Status  06/17/2018 141 135 - 146 mmol/L Final  07/12/2015 140 134 - 144 mmol/L Final         Passed - Cr in normal range and within 360 days    Creat  Date Value Ref Range Status  06/17/2018 0.86 0.50 - 1.05 mg/dL Final    Comment:    For patients >28 years of age, the reference limit for Creatinine is approximately 13% higher for people identified as African-American. .          Passed - Ca in normal range and within 360 days    Calcium  Date Value Ref Range Status  06/17/2018 9.9 8.6 - 10.4 mg/dL Final         Passed - Last BP in normal range    BP Readings from Last 1 Encounters:  07/06/18 128/78         Passed - Valid encounter within last 6 months    Recent Outpatient Visits          1 month ago Seasonal allergic rhinitis, unspecified trigger   [place], FNP   6 months ago Acute non-recurrent maxillary sinusitis   [place], [place], FNP   10 months ago Well woman exam   [place], FNP   1 year ago Essential hypertension   [place], FNP   3 years ago Essential hypertension   [facility]  [facility] Ashok Norris, MD

## 2019-05-25 ENCOUNTER — Telehealth: Payer: Self-pay | Admitting: Emergency Medicine

## 2019-05-25 DIAGNOSIS — Z1231 Encounter for screening mammogram for malignant neoplasm of breast: Secondary | ICD-10-CM

## 2019-05-25 NOTE — Telephone Encounter (Signed)
Need a referral for mammogram. Physical and follow up already schedule for 12/10. Will not be able to see you after 12/31 due to insurance will no longer cover [facility]

## 2019-05-25 NOTE — Telephone Encounter (Signed)
Order placed

## 2019-06-24 ENCOUNTER — Ambulatory Visit (INDEPENDENT_AMBULATORY_CARE_PROVIDER_SITE_OTHER): Payer: BLUE CROSS/BLUE SHIELD | Admitting: Family Medicine

## 2019-06-24 ENCOUNTER — Encounter: Payer: Self-pay | Admitting: Family Medicine

## 2019-06-24 ENCOUNTER — Ambulatory Visit
Admission: RE | Admit: 2019-06-24 | Discharge: 2019-06-24 | Disposition: A | Discharge status: Home / Self Care | Payer: BLUE CROSS/BLUE SHIELD | Source: Ambulatory Visit | Attending: Family Medicine | Admitting: Family Medicine

## 2019-06-24 ENCOUNTER — Other Ambulatory Visit: Payer: Self-pay

## 2019-06-24 VITALS — BP 124/76 | HR 86 | Temp 97.5°F | Resp 16 | Ht 62.0 in | Wt 196.0 lb

## 2019-06-24 DIAGNOSIS — Z1231 Encounter for screening mammogram for malignant neoplasm of breast: Secondary | ICD-10-CM | POA: Diagnosis not present

## 2019-06-24 DIAGNOSIS — I10 Essential (primary) hypertension: Secondary | ICD-10-CM | POA: Diagnosis not present

## 2019-06-24 DIAGNOSIS — J302 Other seasonal allergic rhinitis: Secondary | ICD-10-CM | POA: Diagnosis not present

## 2019-06-24 DIAGNOSIS — Z0000 Encounter for general adult medical examination without abnormal findings: Secondary | ICD-10-CM

## 2019-06-24 DIAGNOSIS — N281 Cyst of kidney, acquired: Secondary | ICD-10-CM | POA: Diagnosis not present

## 2019-06-24 DIAGNOSIS — E782 Mixed hyperlipidemia: Secondary | ICD-10-CM

## 2019-06-24 DIAGNOSIS — R7989 Other specified abnormal findings of blood chemistry: Secondary | ICD-10-CM

## 2019-06-24 DIAGNOSIS — E559 Vitamin D deficiency, unspecified: Secondary | ICD-10-CM

## 2019-06-24 DIAGNOSIS — R5383 Other fatigue: Secondary | ICD-10-CM

## 2019-06-24 DIAGNOSIS — E538 Deficiency of other specified B group vitamins: Secondary | ICD-10-CM

## 2019-06-24 MED ORDER — TRIAMTERENE-HCTZ 37.5-25 MG PO TABS: 1.0000 | ORAL_TABLET | Freq: Every day | ORAL | 3 refills | Status: DC

## 2019-06-24 MED ORDER — AZELASTINE HCL 0.1 % NA SOLN: 1.0000 | Freq: Two times a day (BID) | NASAL | 6 refills | Status: DC | PRN

## 2019-06-24 MED ORDER — FLUTICASONE PROPIONATE 50 MCG/ACT NA SUSP: 2.0000 | Freq: Every day | NASAL | 1 refills | Status: DC

## 2019-06-24 MED ORDER — LEVOCETIRIZINE DIHYDROCHLORIDE 5 MG PO TABS: 5.0000 mg | ORAL_TABLET | Freq: Every day | ORAL | 1 refills | Status: DC

## 2019-06-24 NOTE — Progress Notes (Signed)
Name: Amy May   MRN: [id number]    DOB: [birth year]   Date:06/24/2019       Progress Note  Subjective  Chief Complaint  Chief Complaint  Patient presents with  . Annual Exam    HPI  Patient presents for annual CPE.  Diet: She has been trying to eat more regular meals Exercise: She was trying to exercise, but her feet started bothering her again.  She has changed her shoes.  She has seen podiatry in the past and was recommended to have surgery - she does not want to go through with surgery at this time.   USPSTF grade A and B recommendations    Office Visit from 06/24/2019 in [facility]  AUDIT-C Score  0     Depression: Phq 9 is  negative Depression screen [facility] 2/9 06/24/2019 03/10/2019 10/07/2018 06/17/2018 04/30/2018  Decreased Interest 0 0 0 0 0  Down, Depressed, Hopeless 0 0 0 0 0  PHQ - 2 Score 0 0 0 0 0  Altered sleeping 0 0 0 0 0  Tired, decreased energy 0 0 0 0 0  Change in appetite 0 0 0 0 0  Feeling bad or failure about yourself  0 0 0 0 0  Trouble concentrating 0 0 0 0 0  Moving slowly or fidgety/restless 0 0 0 0 0  Suicidal thoughts 0 0 0 0 0  PHQ-9 Score 0 0 0 0 0  Difficult doing work/chores Not difficult at all Not difficult at all - Not difficult at all Not difficult at all   Hypertension: BP Readings from Last 3 Encounters:  06/24/19 124/76  07/06/18 128/78  06/17/18 128/72   Obesity: Wt Readings from Last 3 Encounters:  06/24/19 196 lb (88.9 kg)  07/06/18 197 lb (89.4 kg)  06/17/18 196 lb 3.2 oz (89 kg)   BMI Readings from Last 3 Encounters:  06/24/19 35.85 kg/m  07/06/18 36.03 kg/m  06/17/18 32.65 kg/m     Hep C Screening: Negative 2019 STD testing and prevention (HIV/chl/gon/syphilis): HIV negative 2019. Intimate partner violence: No concerns Sexual History/Pain during Intercourse: No concerns Menstrual History/LMP/Abnormal Bleeding: Early menopause at age 68. Incontinence Symptoms: No concerns  Breast  cancer:  - Last Mammogram: Scheduled later today - BRCA gene screening: No family history  Osteoporosis: discussed high calcium and vitamin D supplementation. Did have early menopause - will check DEXA next year at age 53.  Cervical cancer screening: UTD - due in 2022  Skin cancer: discussed atypical lesions  Colorectal cancer: Denies family or personal history of colorectal cancer, no changes in BM's - no blood in stool, dark and tarry stool, mucus in stool, or constipation/diarrhea. Had colonoscopy last year; on 10-year follow up plan. Lung cancer: Never Smoker.  Low Dose CT Chest recommended if Age 63-80 years, 30 pack-year currently smoking OR have quit w/in 15years. Patient does not qualify.   ECG: Denies chest pain, shortness of breath, or palpitations  Advanced Care Planning: A voluntary discussion about advance care planning including the explanation and discussion of advance directives.  Discussed health care proxy and Living will, and the patient was able to identify a health care proxy as Junie Panning and Jeraldine Loots (Her children).  Patient does not have a living will at present time. If patient does have living will, I have requested they bring this to the clinic to be scanned in to their chart.  Lipids: Lab Results  Component Value Date   CHOL 182  06/17/2018   CHOL 175 04/30/2018   CHOL 179 07/12/2015   Lab Results  Component Value Date   HDL 51 06/17/2018   HDL 41 (L) 04/30/2018   HDL 54 07/12/2015   Lab Results  Component Value Date   LDLCALC 110 (H) 06/17/2018   [place]  04/30/2018     Comment:     . LDL cholesterol not calculated. Triglyceride levels greater than 400 mg/dL invalidate calculated LDL results. . Reference range: <100 . Desirable range <100 mg/dL for primary prevention;   <70 mg/dL for patients with CHD or diabetic patients  with > or = 2 CHD risk factors. Marland Kitchen LDL-C is now calculated using the Martin-Hopkins  calculation, which is a validated novel  method providing  better accuracy than the Friedewald equation in the  estimation of LDL-C.  Cresenciano Genre et al. Annamaria Helling. 1610;960(45): 2061-2068  (http://education.[facility].com/faq/FAQ164)    LDLCALC 106 (H) 07/12/2015   Lab Results  Component Value Date   TRIG 107 06/17/2018   TRIG 412 (H) 04/30/2018   TRIG 96 07/12/2015   Lab Results  Component Value Date   CHOLHDL 3.6 06/17/2018   CHOLHDL 4.3 04/30/2018   CHOLHDL 3.3 07/12/2015   No results found for: LDLDIRECT  Glucose: Glucose  Date Value Ref Range Status  07/12/2015 95 65 - 99 mg/dL Final   Glucose, Bld  Date Value Ref Range Status  06/17/2018 88 65 - 99 mg/dL Final    Comment:    .            Fasting reference interval .   04/30/2018 115 65 - 139 mg/dL Final    Comment:    .        Non-fasting reference interval .     Patient Active Problem List   Diagnosis Date Noted  . Low vitamin D level 07/14/2018  . Abnormal TSH 07/14/2018  . Paroxysmal tachycardia ([place]) 07/06/2018  . Abnormal EKG 07/05/2018  . Palpitations 07/05/2018  . Encounter for screening colonoscopy   . Chronic right shoulder pain 04/30/2018  . Body aches 04/30/2018  . Essential hypertension 07/12/2015  . Class 1 obesity due to excess calories with serious comorbidity and body mass index (BMI) of 32.0 to 32.9 in adult 07/12/2015  . Hyperlipidemia 07/12/2015  . Female genuine stress incontinence 11/10/2013  . Acquired cyst of kidney 11/10/2013    Past Surgical History:  Procedure Laterality Date  . COLONOSCOPY WITH PROPOFOL N/A 05/25/2018   Procedure: COLONOSCOPY WITH PROPOFOL;  Surgeon: Lin Landsman, MD;  [facility] ENDOSCOPY;  Service: Gastroenterology;  Laterality: N/A;  . DILATION AND CURETTAGE OF UTERUS    . TUBAL LIGATION      Family History  Problem Relation Age of Onset  . Heart disease Maternal Aunt   . Breast cancer Neg Hx     Social History   Socioeconomic History  . Marital status: Divorced     Spouse name: Not on file  . Number of children: 2  . Years of education: Not on file  . Highest education level: Not on file  Occupational History  . Occupation: Retail buyer: [employer]  Tobacco Use  . Smoking status: Never Smoker  . Smokeless tobacco: Never Used  Substance and Sexual Activity  . Alcohol use: No    Alcohol/week: 0.0 standard drinks  . Drug use: No  . Sexual activity: Not Currently    Partners: Male  Other Topics Concern  . Not on file  Social History  Narrative  . Not on file   Social Determinants of Health   Financial Resource Strain: Low Risk   . Difficulty of Paying Living Expenses: Not hard at all  Food Insecurity: No Food Insecurity  . Worried About Charity fundraiser in the Last Year: Never true  . Ran Out of Food in the Last Year: Never true  Transportation Needs: No Transportation Needs  . Lack of Transportation (Medical): No  . Lack of Transportation (Non-Medical): No  Physical Activity: Insufficiently Active  . Days of Exercise per Week: 1 day  . Minutes of Exercise per Session: 30 min  Stress: No Stress Concern Present  . Feeling of Stress : Not at all  Social Connections: Slightly Isolated  . Frequency of Communication with Friends and Family: More than three times a week  . Frequency of Social Gatherings with Friends and Family: More than three times a week  . Attends Religious Services: More than 4 times per year  . Active Member of Clubs or Organizations: Yes  . Attends Archivist Meetings: More than 4 times per year  . Marital Status: Divorced  Human resources officer Violence: Not At Risk  . Fear of Current or Ex-Partner: No  . Emotionally Abused: No  . Physically Abused: No  . Sexually Abused: No     Current Outpatient Medications:  .  azelastine (ASTELIN) 0.1 % nasal spray, Place 1 spray into both nostrils 2 (two) times daily as needed for rhinitis or allergies. Use in each nostril as directed, Disp: 30 mL, Rfl: 6 .   fluticasone (FLONASE) 50 MCG/ACT nasal spray, Place 2 sprays into both nostrils daily., Disp: 48 g, Rfl: 1 .  levocetirizine (XYZAL) 5 MG tablet, Take 1 tablet (5 mg total) by mouth daily., Disp: 90 tablet, Rfl: 1 .  triamterene-hydrochlorothiazide (MAXZIDE-25) 37.5-25 MG tablet, Take 1 tablet by mouth daily., Disp: 30 tablet, Rfl: 0 .  allopurinol (ZYLOPRIM) 100 MG tablet, Take 100 mg by mouth as needed. For gout flare, Disp: , Rfl:  .  meloxicam (MOBIC) 15 MG tablet, meloxicam 15 mg tablet  TAKE 1 TABLET BY MOUTH EVERY DAY, Disp: , Rfl:  .  methocarbamol (ROBAXIN) 750 MG tablet, methocarbamol 750 mg tablet  TAKE 1 TABLET BY MOUTH THREE TIMES A DAY, Disp: , Rfl:   No Known Allergies   ROS  Constitutional: Negative for fever or weight change.  Respiratory: Negative for cough and shortness of breath.   Cardiovascular: Negative for chest pain or palpitations.  Gastrointestinal: Negative for abdominal pain, no bowel changes.  Musculoskeletal: Negative for gait problem or joint swelling.  Skin: Negative for rash.  Neurological: Negative for dizziness or headache.  No other specific complaints in a complete review of systems (except as listed in HPI above).  Objective  Vitals:   06/24/19 0741  BP: 124/76  Pulse: 86  Resp: 16  Temp: (!) 97.5 F (36.4 C)  TempSrc: Temporal  SpO2: 98%  Weight: 196 lb (88.9 kg)  Height: _0  (1.575 m)    Body mass index is 35.85 kg/m.  Physical Exam  Constitutional: Patient appears well-developed and well-nourished. No distress.  HENT: Head: Normocephalic and atraumatic. Ears: B TMs ok, no erythema or effusion; Nose: Nose normal. Mouth/Throat: Oropharynx is clear and moist. No oropharyngeal exudate.  Eyes: Conjunctivae and EOM are normal. Pupils are equal, round, and reactive to light. No scleral icterus.  Neck: Normal range of motion. Neck supple. No JVD present. No thyromegaly present.  Cardiovascular:  Normal rate, regular rhythm and normal  heart sounds.  No murmur heard. No BLE edema. Pulmonary/Chest: Effort normal and breath sounds normal. No respiratory distress. Abdominal: Soft. Bowel sounds are normal, no distension. There is no tenderness. no masses Breast: no lumps or masses, no nipple discharge or rashes FEMALE GENITALIA:  Deferred Musculoskeletal: Normal range of motion, no joint effusions. No gross deformities Neurological: he is alert and oriented to person, place, and time. No cranial nerve deficit. Coordination, balance, strength, speech and gait are normal.  Skin: Skin is warm and dry. No rash noted. No erythema.  Psychiatric: Patient has a normal mood and affect. behavior is normal. Judgment and thought content normal.  No results found for this or any previous visit (from the past 2160 hour(s)).  Fall Risk: Fall Risk  06/24/2019 03/10/2019 06/17/2018 04/30/2018 07/12/2015  Falls in the past year? 0 0 0 No No  Number falls in past yr: 0 0 0 - -  Injury with Fall? 0 0 0 - -  Follow up Falls evaluation completed Falls evaluation completed - - -   Assessment & Plan  1. Well woman exam (no gynecological exam) -USPSTF grade A and B recommendations reviewed with patient; age-appropriate recommendations, preventive care, screening tests, etc discussed and encouraged; healthy living encouraged; see AVS for patient education given to patient -Discussed importance of 150 minutes of physical activity weekly, eat two servings of fish weekly, eat one serving of tree nuts ( cashews, pistachios, pecans, almonds.Marland Kitchen) every other day, eat 6 servings of fruit/vegetables daily and drink plenty of water and avoid sweet beverages.

## 2019-06-25 ENCOUNTER — Other Ambulatory Visit: Payer: Self-pay | Admitting: Family Medicine

## 2019-06-25 ENCOUNTER — Encounter: Payer: BLUE CROSS/BLUE SHIELD | Admitting: Family Medicine

## 2019-06-25 DIAGNOSIS — R7989 Other specified abnormal findings of blood chemistry: Secondary | ICD-10-CM

## 2019-06-25 LAB — CBC WITH DIFFERENTIAL/PLATELET
Absolute Monocytes: 299 cells/uL (ref 200–950)
Basophils Absolute: 60 cells/uL (ref 0–200)
Basophils Relative: 1.3 %
Eosinophils Absolute: 69 cells/uL (ref 15–500)
Eosinophils Relative: 1.5 %
HCT: 40.9 % (ref 35.0–45.0)
Hemoglobin: 13.8 g/dL (ref 11.7–15.5)
Lymphs Abs: 2024 cells/uL (ref 850–3900)
MCH: 29.2 pg (ref 27.0–33.0)
MCHC: 33.7 g/dL (ref 32.0–36.0)
MCV: 86.7 fL (ref 80.0–100.0)
MPV: 11.4 fL (ref 7.5–12.5)
Monocytes Relative: 6.5 %
Neutro Abs: 2148 cells/uL (ref 1500–7800)
Neutrophils Relative %: 46.7 %
Platelets: 272 10*3/uL (ref 140–400)
RBC: 4.72 10*6/uL (ref 3.80–5.10)
RDW: 13.1 % (ref 11.0–15.0)
Total Lymphocyte: 44 %
WBC: 4.6 10*3/uL (ref 3.8–10.8)

## 2019-06-25 LAB — COMPLETE METABOLIC PANEL WITH GFR
AG Ratio: 1.4 (calc) (ref 1.0–2.5)
ALT: 21 U/L (ref 6–29)
AST: 15 U/L (ref 10–35)
Albumin: 4.3 g/dL (ref 3.6–5.1)
Alkaline phosphatase (APISO): 80 U/L (ref 37–153)
BUN/Creatinine Ratio: 17 (calc) (ref 6–22)
BUN: 18 mg/dL (ref 7–25)
CO2: 27 mmol/L (ref 20–32)
Calcium: 9.6 mg/dL (ref 8.6–10.4)
Chloride: 106 mmol/L (ref 98–110)
Creat: 1.06 mg/dL — ABNORMAL HIGH (ref 0.50–1.05)
GFR, Est African American: 69 mL/min/{1.73_m2} (ref >=60)
GFR, Est Non African American: 59 mL/min/{1.73_m2} — ABNORMAL LOW (ref >=60)
Globulin: 3 g/dL (calc) (ref 1.9–3.7)
Glucose, Bld: 97 mg/dL (ref 65–99)
Potassium: 4.4 mmol/L (ref 3.5–5.3)
Sodium: 140 mmol/L (ref 135–146)
Total Bilirubin: 0.5 mg/dL (ref 0.2–1.2)
Total Protein: 7.3 g/dL (ref 6.1–8.1)

## 2019-06-25 LAB — B12 AND FOLATE PANEL
Folate: 20.3 ng/mL
Vitamin B-12: 582 pg/mL (ref 200–1100)

## 2019-06-25 LAB — LIPID PANEL
Cholesterol: 178 mg/dL (ref <200)
HDL: 46 mg/dL — ABNORMAL LOW (ref >=50)
LDL Cholesterol (Calc): 104 mg/dL (calc) — ABNORMAL HIGH
Non-HDL Cholesterol (Calc): 132 mg/dL (calc) — ABNORMAL HIGH (ref <130)
Total CHOL/HDL Ratio: 3.9 (calc) (ref <5.0)
Triglycerides: 165 mg/dL — ABNORMAL HIGH (ref <150)

## 2019-06-25 LAB — THYROID PANEL WITH TSH
Free Thyroxine Index: 2.4 (ref 1.4–3.8)
T3 Uptake: 31 % (ref 22–35)
T4, Total: 7.7 ug/dL (ref 5.1–11.9)
TSH: 0.38 mIU/L — ABNORMAL LOW

## 2019-06-25 LAB — VITAMIN D 25 HYDROXY (VIT D DEFICIENCY, FRACTURES): Vit D, 25-Hydroxy: 19 ng/mL — ABNORMAL LOW (ref 30–100)

## 2019-07-14 IMAGING — MG DIGITAL SCREENING BILATERAL MAMMOGRAM WITH TOMO AND CAD
8 series · 8 of 24 positions shown · non-contrast
Comparison: Previous exam(s).

CLINICAL DATA: Screening.

EXAM:
DIGITAL SCREENING BILATERAL MAMMOGRAM WITH TOMO AND CAD

[R CC synth-2D]
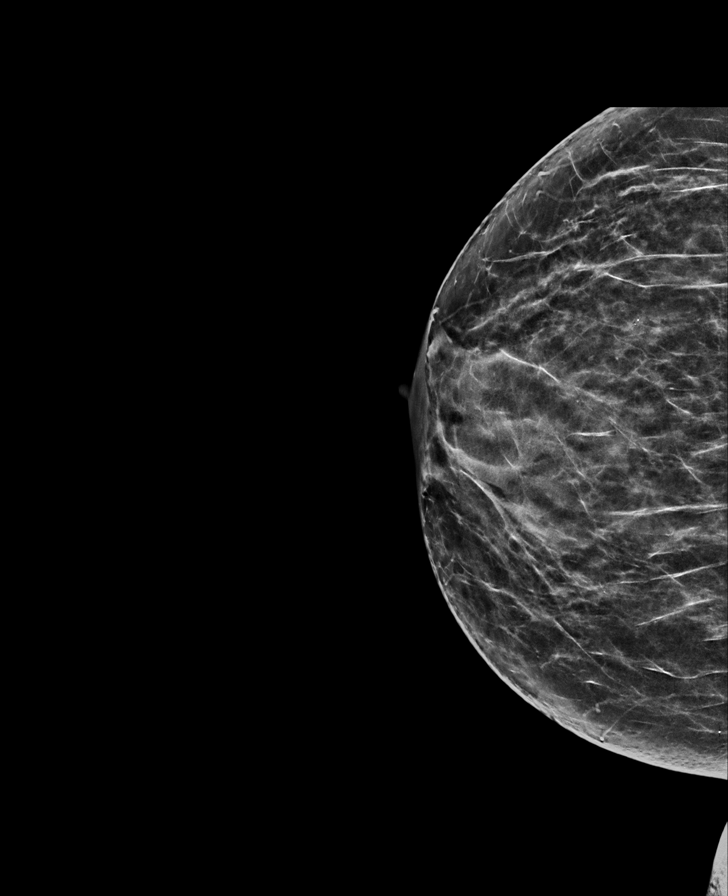

[L CC synth-2D]
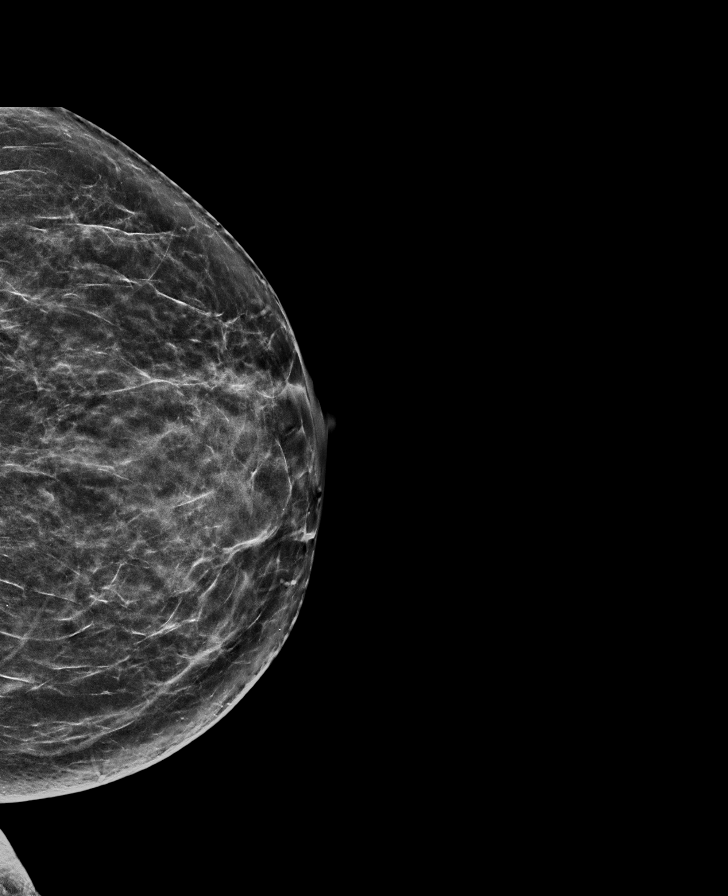

[L MLO synth-2D]
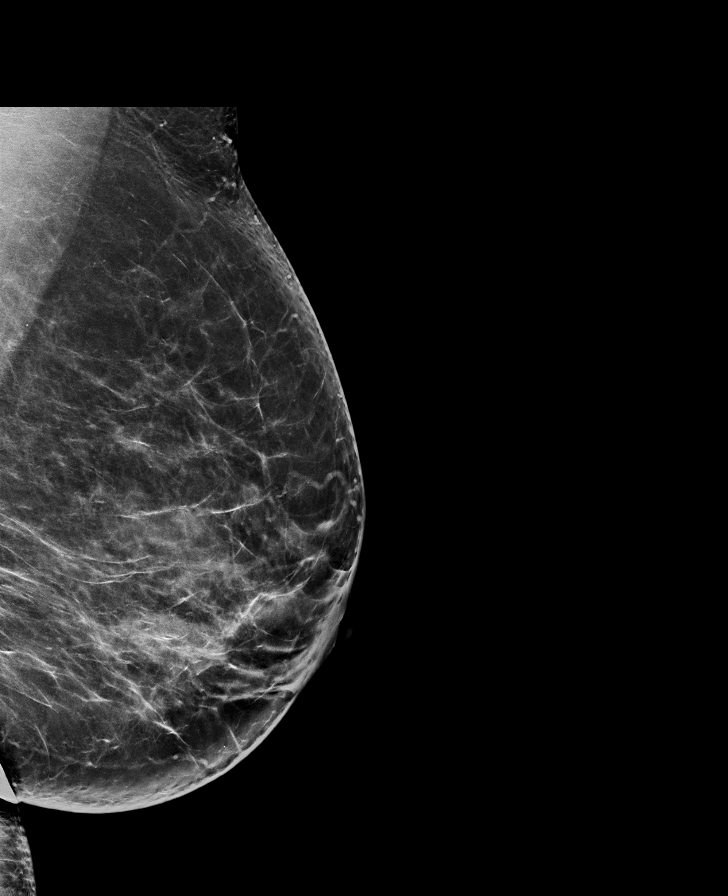

[R MLO synth-2D]
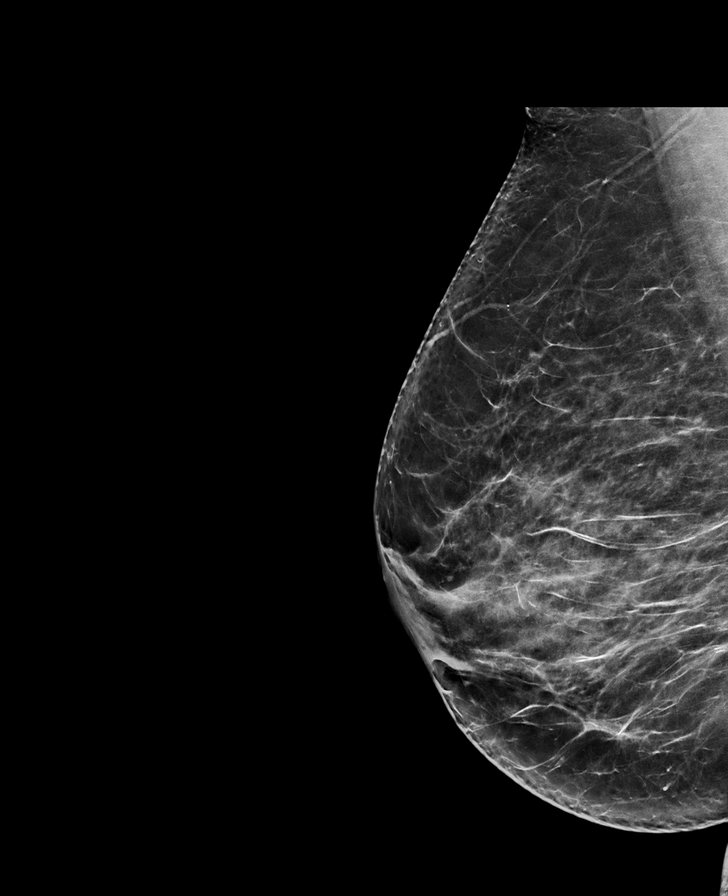

[R CC tomo · tomo slice 35/68.0]
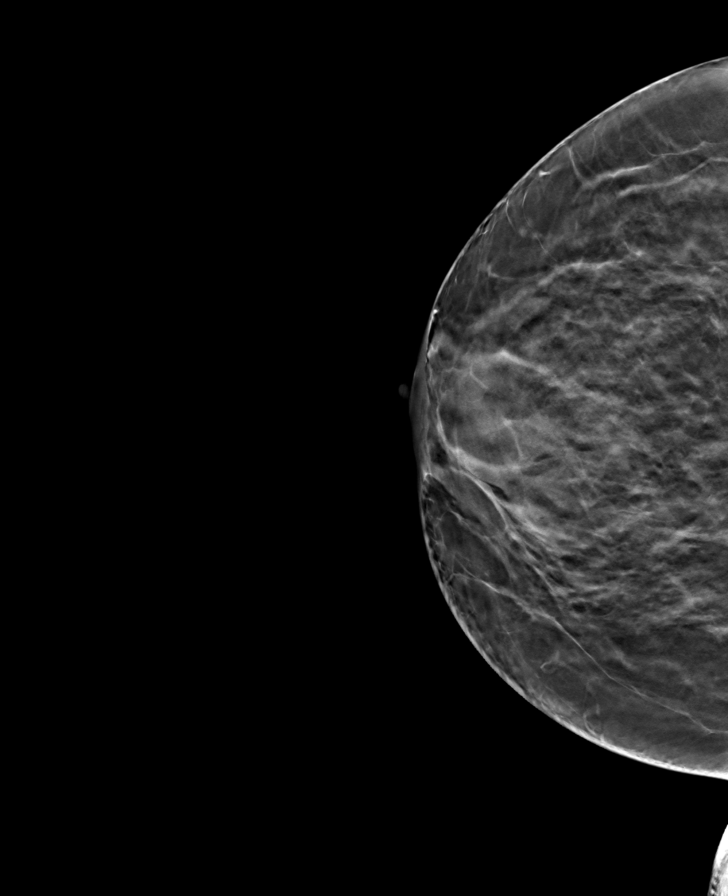

[L CC tomo · tomo slice 36/71.0]
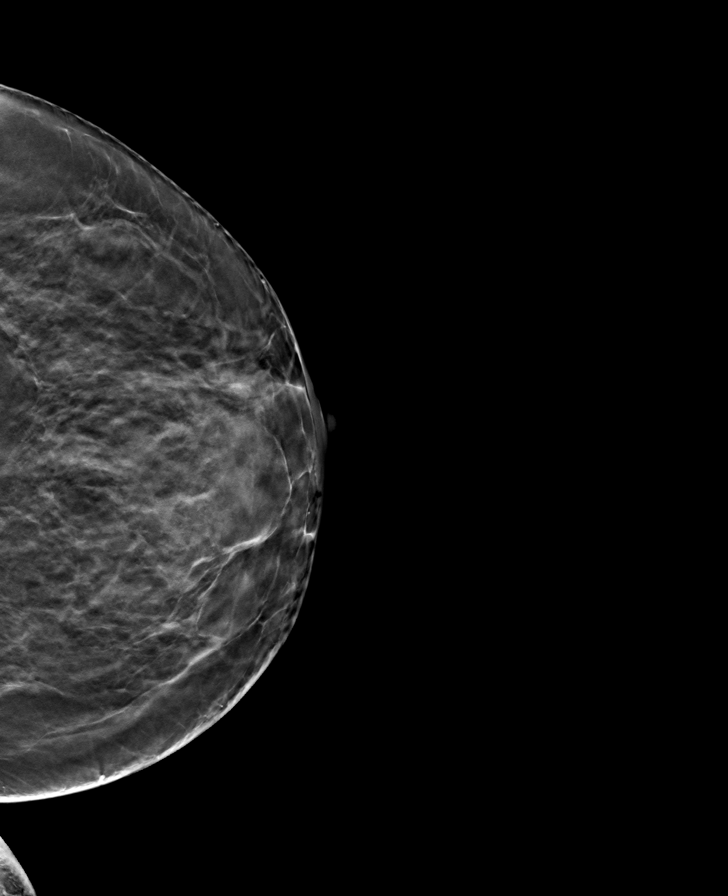

[R MLO tomo · tomo slice 41/80.0]
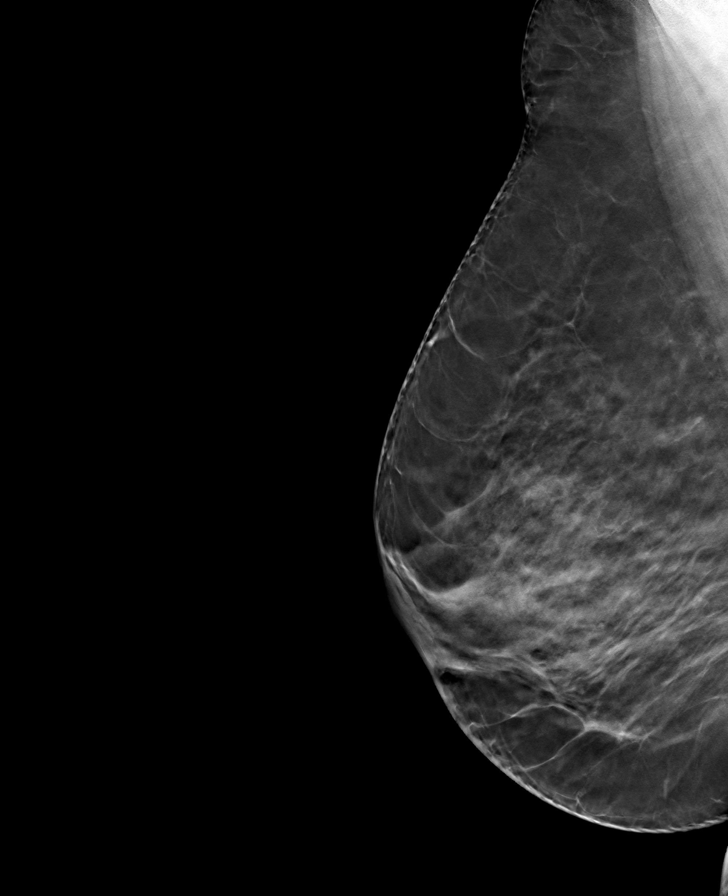

[L MLO tomo · tomo slice 42/83.0]
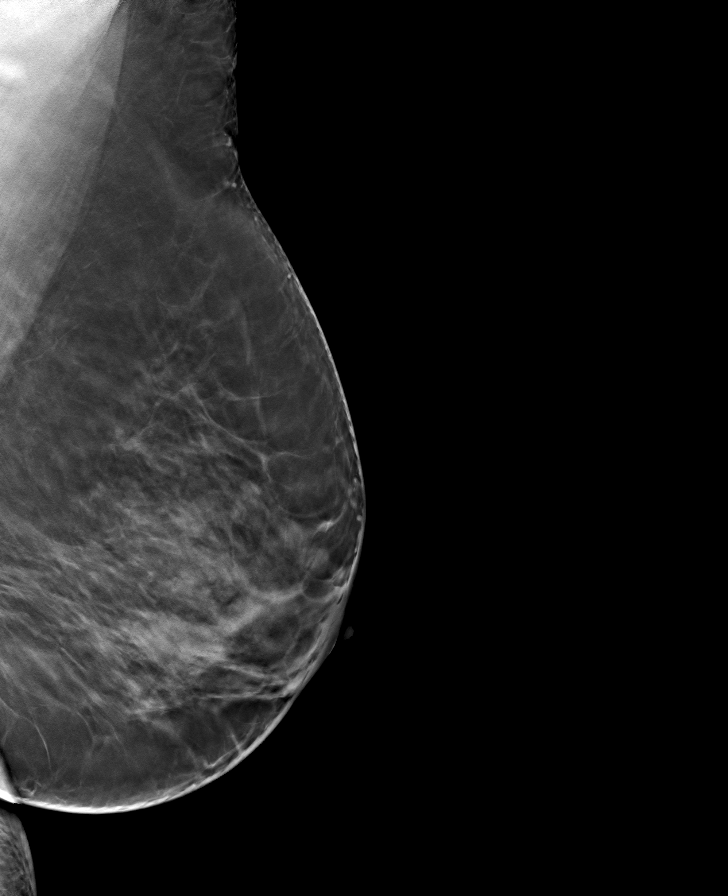

[8 of 24 positions shown; findings below may reference images not displayed]

ACR Breast Density Category c: The breast tissue is heterogeneously
dense, which may obscure small masses.
FINDINGS: There are no findings suspicious for malignancy. Images were
processed with CAD.
IMPRESSION: No mammographic evidence of malignancy. A result letter of this
screening mammogram will be mailed directly to the patient.

RECOMMENDATION:
Screening mammogram in one year. (Code:[id number])

BI-RADS CATEGORY  1: Negative.

## 2019-08-05 ENCOUNTER — Telehealth: Payer: Self-pay | Admitting: Family Medicine

## 2019-08-05 NOTE — Telephone Encounter (Signed)
Patient notified

## 2019-08-05 NOTE — Telephone Encounter (Signed)
-----   Message from Hubbard Hartshorn, FNP sent at 06/25/2019  7:27 AM EST ----- Regarding: Abnormal TSH Call patient to come in for TSH recheck

## 2019-08-11 ENCOUNTER — Other Ambulatory Visit: Payer: Self-pay

## 2019-08-11 ENCOUNTER — Encounter: Payer: Self-pay | Admitting: Family Medicine

## 2019-08-11 ENCOUNTER — Ambulatory Visit (INDEPENDENT_AMBULATORY_CARE_PROVIDER_SITE_OTHER): Payer: BLUE CROSS/BLUE SHIELD | Admitting: Family Medicine

## 2019-08-11 DIAGNOSIS — Z20822 Contact with and (suspected) exposure to covid-19: Secondary | ICD-10-CM

## 2019-08-11 NOTE — Patient Instructions (Signed)
Cool Mist Vaporizer A cool mist vaporizer is a device that releases a cool mist into the air. If you have a cough or a cold, using a vaporizer may help relieve your symptoms. The mist adds moisture to the air, which may help thin your mucus and make it less sticky. When your mucus is thin and less sticky, it easier for you to breathe and to cough up secretions. Do not use a vaporizer if you are allergic to mold. Follow these instructions at home:  Follow the instructions that come with the vaporizer.  Do not use anything other than distilled water in the vaporizer.  Do not run the vaporizer all of the time. Doing that can cause mold or bacteria to grow in the vaporizer.  Clean the vaporizer after each time that you use it.  Clean and dry the vaporizer well before storing it.  Stop using the vaporizer if your breathing symptoms get worse. This information is not intended to replace advice given to you by your health care provider. Make sure you discuss any questions you have with your health care provider. Document Revised: 07/18/2016 Document Reviewed: 09/30/2015 Elsevier Patient Education  Amy May.

## 2019-08-11 NOTE — Progress Notes (Signed)
Name: Amy May   MRN: [id number]    DOB: 04/08/1966   Date:08/11/2019       Progress Note  Subjective  Chief Complaint  Chief Complaint  Patient presents with  . Sinus Problem    nasal drainage, will be tested for Covid today. Son positive    I connected with  DEMARIS WIRTZ  on 08/11/19 at  7:40 AM EST by a video enabled telemedicine application and verified that I am speaking with the correct person using two identifiers.  I discussed the limitations of evaluation and management by telemedicine and the availability of in person appointments. The patient expressed understanding and agreed to proceed. Staff also discussed with the patient that there may be a patient responsible charge related to this service. Patient Location: Home Provider Location: Office Additional Individuals present: None  HPI  Pt presents with concern for COVID-19 symptoms with close contact with her Son who did test positive.  She is already scheduled for testing this morning.  She reports occasional cough, loss of smell and taste, sinus congestion and drainage.  She is using azelastine and flonase, also taking xyzal.  Denies chest pain, shortness of breath, GI Upset, fevers/chills, or significant fatigue.  Symptom onset 08/08/19, her son tested positive 08/05/19.  Patient Active Problem List   Diagnosis Date Noted  . Low vitamin D level 07/14/2018  . Abnormal TSH 07/14/2018  . Paroxysmal tachycardia ([place]) 07/06/2018  . Abnormal EKG 07/05/2018  . Palpitations 07/05/2018  . Encounter for screening colonoscopy   . Chronic right shoulder pain 04/30/2018  . Body aches 04/30/2018  . Essential hypertension 07/12/2015  . Class 1 obesity due to excess calories with serious comorbidity and body mass index (BMI) of 32.0 to 32.9 in adult 07/12/2015  . Hyperlipidemia 07/12/2015  . Female genuine stress incontinence 11/10/2013  . Acquired cyst of kidney 11/10/2013    Social History   Tobacco Use  .  Smoking status: Never Smoker  . Smokeless tobacco: Never Used  Substance Use Topics  . Alcohol use: No    Alcohol/week: 0.0 standard drinks     Current Outpatient Medications:  .  azelastine (ASTELIN) 0.1 % nasal spray, Place 1 spray into both nostrils 2 (two) times daily as needed for rhinitis or allergies. Use in each nostril as directed, Disp: 30 mL, Rfl: 6 .  fluticasone (FLONASE) 50 MCG/ACT nasal spray, Place 2 sprays into both nostrils daily., Disp: 48 g, Rfl: 1 .  levocetirizine (XYZAL) 5 MG tablet, Take 1 tablet (5 mg total) by mouth daily., Disp: 90 tablet, Rfl: 1 .  triamterene-hydrochlorothiazide (MAXZIDE-25) 37.5-25 MG tablet, Take 1 tablet by mouth daily., Disp: 90 tablet, Rfl: 3  No Known Allergies  I personally reviewed active problem list, medication list, allergies, notes from last encounter with the patient/caregiver today.  ROS  Ten systems reviewed and is negative except as mentioned in HPI  Objective  Virtual encounter, vitals not obtained.  There is no height or weight on file to calculate BMI.  Nursing Note and Vital Signs reviewed.  Physical Exam  Pulmonary/Chest: Effort normal. No respiratory distress. Speaking in complete sentences Neurological: Pt is alert and oriented to person, place, and time. Coordination, speech and gait are normal.  Psychiatric: Patient has a normal mood and affect. behavior is normal. Judgment and thought content normal.  No results found for this or any previous visit (from the past 72 hour(s)).  Assessment & Plan  1. Close exposure to COVID-19 virus -  Significant education provided regarding COVID-19 symptoms, OTC symptom management for right now as her symptoms are mild. She will add Mucinex OTC, continue nasal sprays and Xyzal, add humidifier. -Red flags and when to present for emergency care or RTC including fever >101.70F, chest pain, shortness of breath, new/worsening/un-resolving symptoms, reviewed with patient at  time of visit. Follow up and care instructions discussed and provided in AVS. - I discussed the assessment and treatment plan with the patient. The patient was provided an opportunity to ask questions and all were answered. The patient agreed with the plan and demonstrated an understanding of the instructions. I provided 16 minutes of non-face-to-face time during this encounter.  Hubbard Hartshorn, FNP

## 2019-08-12 ENCOUNTER — Ambulatory Visit: Payer: BLUE CROSS/BLUE SHIELD | Admitting: Family Medicine

## 2019-08-12 ENCOUNTER — Telehealth: Payer: Self-pay | Admitting: Emergency Medicine

## 2019-08-12 NOTE — Telephone Encounter (Signed)
Patient called and state she was Covid Positive. Tested at [facility]

## 2019-08-13 ENCOUNTER — Telehealth: Payer: Self-pay | Admitting: Emergency Medicine

## 2019-08-13 DIAGNOSIS — U071 COVID-19: Secondary | ICD-10-CM

## 2019-08-13 DIAGNOSIS — R05 Cough: Secondary | ICD-10-CM

## 2019-08-13 DIAGNOSIS — R059 Cough, unspecified: Secondary | ICD-10-CM

## 2019-08-13 MED ORDER — PROMETHAZINE-DM 6.25-15 MG/5ML PO SYRP: 5.0000 mL | ORAL_SOLUTION | Freq: Four times a day (QID) | ORAL | 0 refills | Status: DC | PRN

## 2019-08-13 NOTE — Telephone Encounter (Signed)
Documentation reviewed 

## 2019-08-13 NOTE — Telephone Encounter (Signed)
COVID-19 is a virus, antibiotic will likely not help symptoms at this time.  If anything change over the weekend - worsening/severe symptoms, chest pain, shortness of breath, she needs to present to ER or UC.   I will send in cough medication - we talked about this at her visit and she said she would call back if wanting, so I have sent this to her pharmacy.

## 2019-08-13 NOTE — Telephone Encounter (Signed)
Can she get a script for antibiotic and a cough syrup. Has been using Mucinex but feels like something in hung in her throat

## 2019-08-13 NOTE — Telephone Encounter (Signed)
Just have a lot of cough and nasal drainage (green). Mild facial pressure but feels ok

## 2019-08-23 ENCOUNTER — Telehealth: Payer: Self-pay | Admitting: Emergency Medicine

## 2019-08-23 NOTE — Telephone Encounter (Signed)
Still have severe cough. Can tussionex be called in for her. Has tried otc medication and what was prescribed before. Nothing is helping

## 2019-08-24 ENCOUNTER — Encounter: Payer: Self-pay | Admitting: Family Medicine

## 2019-08-24 ENCOUNTER — Ambulatory Visit (INDEPENDENT_AMBULATORY_CARE_PROVIDER_SITE_OTHER): Payer: BLUE CROSS/BLUE SHIELD | Admitting: Family Medicine

## 2019-08-24 ENCOUNTER — Other Ambulatory Visit: Payer: Self-pay

## 2019-08-24 DIAGNOSIS — R002 Palpitations: Secondary | ICD-10-CM | POA: Diagnosis not present

## 2019-08-24 DIAGNOSIS — U071 COVID-19: Secondary | ICD-10-CM | POA: Diagnosis not present

## 2019-08-24 DIAGNOSIS — R05 Cough: Secondary | ICD-10-CM

## 2019-08-24 DIAGNOSIS — R058 Other specified cough: Secondary | ICD-10-CM

## 2019-08-24 MED ORDER — HYDROCOD POLST-CPM POLST ER 10-8 MG/5ML PO SUER: 5.0000 mL | Freq: Two times a day (BID) | ORAL | 0 refills | Status: AC | PRN

## 2019-08-24 MED ORDER — DOXYCYCLINE HYCLATE 100 MG PO TABS: 100.0000 mg | ORAL_TABLET | Freq: Two times a day (BID) | ORAL | 0 refills | Status: AC

## 2019-08-24 NOTE — Progress Notes (Signed)
Name: Amy May   MRN: [id number]    DOB: [birth year]   Date:08/24/2019       Progress Note  Subjective  Chief Complaint  Chief Complaint  Patient presents with  . Cough  . Palpitations  . Shortness of Breath    I connected with  [name] on 08/24/19 at  7:40 AM EST by telephone and verified that I am speaking with the correct person using two identifiers.   I discussed the limitations, risks, security and privacy concerns of performing an evaluation and management service by telephone and the availability of in person appointments. Staff also discussed with the patient that there may be a patient responsible charge related to this service. Patient Location: Home Provider Location: Home Office Additional Individuals present: None  HPI  Pt presents with concern for cough, and palpitations in the context of COVID-19 infection.   - Palpitations - she notes that yesterday she had palpitations intermittently throughout the day, she rested and drank a lot of water, and states palpitations have resolved completely today.  No chest pains, no shortness of breath, no BLE edema.  She does have history of paroxysmal tachycardia - saw Dr. Rockey Situ with last visit in 2019 who said to f/u as needed.  - Cough - has been using promethazine-DM, has also tried OTC Zarbee's without relief.  She notes cough alternatives between productive and dry.  - COVID-19 Infection: Symptom onset 08/08/19, tested positive 08/11/19; Health dept cleared her to return to work 08/22/19.  Patient Active Problem List   Diagnosis Date Noted  . Low vitamin D level 07/14/2018  . Abnormal TSH 07/14/2018  . Paroxysmal tachycardia ([place]) 07/06/2018  . Abnormal EKG 07/05/2018  . Palpitations 07/05/2018  . Chronic right shoulder pain 04/30/2018  . Body aches 04/30/2018  . Essential hypertension 07/12/2015  . Class 1 obesity due to excess calories with serious comorbidity and body mass index (BMI) of 32.0 to 32.9 in  adult 07/12/2015  . Hyperlipidemia 07/12/2015  . Female genuine stress incontinence 11/10/2013  . Acquired cyst of kidney 11/10/2013    Social History   Tobacco Use  . Smoking status: Never Smoker  . Smokeless tobacco: Never Used  Substance Use Topics  . Alcohol use: No    Alcohol/week: 0.0 standard drinks     Current Outpatient Medications:  .  azelastine (ASTELIN) 0.1 % nasal spray, Place 1 spray into both nostrils 2 (two) times daily as needed for rhinitis or allergies. Use in each nostril as directed, Disp: 30 mL, Rfl: 6 .  fluticasone (FLONASE) 50 MCG/ACT nasal spray, Place 2 sprays into both nostrils daily., Disp: 48 g, Rfl: 1 .  levocetirizine (XYZAL) 5 MG tablet, Take 1 tablet (5 mg total) by mouth daily., Disp: 90 tablet, Rfl: 1 .  promethazine-dextromethorphan (PROMETHAZINE-DM) 6.25-15 MG/5ML syrup, Take 5 mLs by mouth 4 (four) times daily as needed., Disp: 118 mL, Rfl: 0 .  triamterene-hydrochlorothiazide (MAXZIDE-25) 37.5-25 MG tablet, Take 1 tablet by mouth daily., Disp: 90 tablet, Rfl: 3  No Known Allergies  I personally reviewed active problem list, medication list, allergies, notes from last encounter, lab results with the patient/caregiver today.  ROS  Constitutional: Negative for fever or weight change.  Respiratory: Positive for cough and negative for shortness of breath.   Cardiovascular: Negative for chest pain, positive for palpitations.  Gastrointestinal: Negative for abdominal pain, no bowel changes.  Musculoskeletal: Negative for gait problem or joint swelling.  Skin: Negative for rash.  Neurological:  Negative for dizziness or headache.  No other specific complaints in a complete review of systems (except as listed in HPI above).   Objective  Virtual encounter, vitals not obtained.  There is no height or weight on file to calculate BMI.  Nursing Note and Vital Signs reviewed.  Physical Exam  Pulmonary/Chest: Effort normal. No respiratory  distress. Speaking in complete sentences without any issue.  Neurological: Pt is alert and oriented to person, place, and time. Coordination, speech is normal. Psychiatric: Patient has a normal mood and affect. behavior is normal. Judgment and thought content normal.  No results found for this or any previous visit (from the past 72 hour(s)).  Assessment & Plan  1. Productive cough - Concerned for secondary infection of lungs s/p COVID-19 infection.  We will treat with doxycycline as below. - chlorpheniramine-HYDROcodone (TUSSIONEX PENNKINETIC ER) 10-8 MG/5ML SUER; Take 5 mLs by mouth every 12 (twelve) hours as needed for up to 5 days for cough.  Dispense: 140 mL; Refill: 0 - doxycycline (VIBRA-TABS) 100 MG tablet; Take 1 tablet (100 mg total) by mouth 2 (two) times daily for 7 days.  Dispense: 14 tablet; Refill: 0  2. Palpitations - Completely resolved today.  Strong advised that she MUST go to UC if palpitations recur or for any chest pain, shortness of breath given her history of paroxysmal tachycardia.  She will work on hydrating today with plenty of water and will rest as much as possible.  3. COVID-19 virus infection - chlorpheniramine-HYDROcodone (TUSSIONEX PENNKINETIC ER) 10-8 MG/5ML SUER; Take 5 mLs by mouth every 12 (twelve) hours as needed for up to 5 days for cough.  Dispense: 140 mL; Refill: 0   -Red flags and when to present for emergency care or RTC including fever >101.39F, chest pain, shortness of breath, new/worsening/un-resolving symptoms, return of palpitations reviewed with patient at time of visit. Follow up and care instructions discussed and provided in AVS. - I discussed the assessment and treatment plan with the patient. The patient was provided an opportunity to ask questions and all were answered. The patient agreed with the plan and demonstrated an understanding of the instructions.  - The patient was advised to call back or seek an in-person evaluation if the  symptoms worsen or if the condition fails to improve as anticipated.  I provided 16 minutes of non-face-to-face time during this encounter.  Hubbard Hartshorn, FNP

## 2019-08-24 NOTE — Telephone Encounter (Signed)
Patient had appointment today 2/9

## 2019-08-24 NOTE — Telephone Encounter (Signed)
Needs appointment if having severe cough - I am happy to do virtual or she may be seen in person at the respiratory clinic.

## 2019-08-25 ENCOUNTER — Ambulatory Visit: Admission: RE | Admit: 2019-08-25 | Payer: BLUE CROSS/BLUE SHIELD | Source: Ambulatory Visit | Admitting: *Deleted

## 2019-08-25 ENCOUNTER — Ambulatory Visit
Admission: RE | Admit: 2019-08-25 | Discharge: 2019-08-25 | Disposition: A | Discharge status: Home / Self Care | Payer: BLUE CROSS/BLUE SHIELD | Source: Ambulatory Visit | Attending: Unknown Physician Specialty | Admitting: Unknown Physician Specialty

## 2019-08-25 ENCOUNTER — Ambulatory Visit (INDEPENDENT_AMBULATORY_CARE_PROVIDER_SITE_OTHER): Payer: BLUE CROSS/BLUE SHIELD | Admitting: Unknown Physician Specialty

## 2019-08-25 VITALS — BP 127/83 | HR 110 | Temp 99.1°F | Resp 20 | Ht 62.0 in | Wt 196.6 lb

## 2019-08-25 DIAGNOSIS — R002 Palpitations: Secondary | ICD-10-CM

## 2019-08-25 DIAGNOSIS — R058 Other specified cough: Secondary | ICD-10-CM

## 2019-08-25 DIAGNOSIS — U071 COVID-19: Secondary | ICD-10-CM | POA: Diagnosis present

## 2019-08-25 DIAGNOSIS — R05 Cough: Secondary | ICD-10-CM

## 2019-08-25 MED ORDER — PREDNISONE 20 MG PO TABS: 20.0000 mg | ORAL_TABLET | Freq: Every day | ORAL | 0 refills | Status: DC

## 2019-08-25 MED ORDER — ALBUTEROL SULFATE HFA 108 (90 BASE) MCG/ACT IN AERS: 2.0000 | INHALATION_SPRAY | Freq: Four times a day (QID) | RESPIRATORY_TRACT | 1 refills | Status: DC | PRN

## 2019-08-25 NOTE — Progress Notes (Signed)
BP 127/83 (BP Location: Left Arm, Cuff Size: Large)   Pulse (!) 110   Temp 99.1 F (37.3 C) (Oral)   Resp 20   Ht 5\' 2"  (1.575 m)   Wt 196 lb 9.6 oz (89.2 kg)   SpO2 90%   BMI 35.96 kg/m    Subjective:    Patient ID: Amy May, female    DOB: 10-Feb-1966, 54 y.o.   MRN: [id number]  HPI: Amy May is a 54 y.o. female  Chief Complaint  Patient presents with  . Cough    Started Doxcycline yesterday, Zarbee's Cough Syrup and Tussionex.  [place] [facility] COVID-19    Son tested positive Wednesday 08/11/2019, symptoms started Saturday the 23rd. Went back to work last Thursday  . Palpitations    SOB when she gets palpitations-did not have them before COVID    Seen yesterday with a video visit by Raelyn Ensign FNP.  Covid symptoms  since 1/24 and tested positive 1/27.  Had palpitations 2 days ago which were improved yesterday but today states they happen on occasion.  History of PAT.    Chief complaint today of a cough.  Yesterday received Doxycycline and Tussionex by PCP  Cough Episode onset: 2 weeks. The problem has been unchanged. The problem occurs constantly (Worse at night). The cough is non-productive. Associated symptoms include shortness of breath. Pertinent negatives include no headaches or weight loss. The symptoms are aggravated by lying down. Treatments tried: Tussionex taken last night and did better. There is no history of asthma, COPD, environmental allergies or pneumonia.  Palpitations  This is a new (Has seen Dr. Rockey Situ for PAT.  Return prn) problem. Episode onset: 2 days. The problem occurs intermittently. The problem has been waxing and waning. Exacerbated by: activity such as getting out of chair at work. Associated symptoms include coughing and shortness of breath. Risk factors include obesity.     Relevant past medical, surgical, family and social history reviewed and updated as indicated. Interim medical history since our last visit reviewed. Allergies and  medications reviewed and updated.  Review of Systems  Constitutional: Negative for weight loss.  Respiratory: Positive for cough and shortness of breath.   Cardiovascular: Positive for palpitations.  Allergic/Immunologic: Negative for environmental allergies.  Neurological: Negative for headaches.    Per HPI unless specifically indicated above     Objective:    BP 127/83 (BP Location: Left Arm, Cuff Size: Large)   Pulse (!) 110   Temp 99.1 F (37.3 C) (Oral)   Resp 20   Ht 5\' 2"  (1.575 m)   Wt 196 lb 9.6 oz (89.2 kg)   SpO2 90%   BMI 35.96 kg/m   Wt Readings from Last 3 Encounters:  08/25/19 196 lb 9.6 oz (89.2 kg)  06/24/19 196 lb (88.9 kg)  07/06/18 197 lb (89.4 kg)    Physical Exam Constitutional:      Appearance: She is normal weight.  HENT:     Head: Normocephalic.     Nose: Nose normal.     Mouth/Throat:     Mouth: Mucous membranes are moist.  Cardiovascular:     Rate and Rhythm: Normal rate and regular rhythm.     Pulses: Normal pulses.     Heart sounds: Normal heart sounds. Heart sounds not distant. No murmur.  Pulmonary:     Effort: No respiratory distress.     Breath sounds: No stridor. Decreased breath sounds present. No wheezing, rhonchi or rales.  Comments: Diminished breath sounds in bases.  Right>left Musculoskeletal:     Right lower leg: No edema.     Left lower leg: No edema.  Neurological:     Mental Status: She is alert.   Chest x-ray is normal   EKG NSR.  RBB no change from previous  Results for orders placed or performed in visit on 06/24/19  Lipid panel  Result Value Ref Range   Cholesterol 178 <200 mg/dL   HDL 46 (L) > OR = 50 mg/dL   Triglycerides 165 (H) <150 mg/dL   LDL Cholesterol (Calc) 104 (H) mg/dL (calc)   Total CHOL/HDL Ratio 3.9 <5.0 (calc)   Non-HDL Cholesterol (Calc) 132 (H) <130 mg/dL (calc)  COMPLETE METABOLIC PANEL WITH GFR  Result Value Ref Range   Glucose, Bld 97 65 - 99 mg/dL   BUN 18 7 - 25 mg/dL    Creat 1.06 (H) 0.50 - 1.05 mg/dL   GFR, Est Non African American 59 (L) > OR = 60 mL/min/1.22m2   GFR, Est African American 69 > OR = 60 mL/min/1.72m2   BUN/Creatinine Ratio 17 6 - 22 (calc)   Sodium 140 135 - 146 mmol/L   Potassium 4.4 3.5 - 5.3 mmol/L   Chloride 106 98 - 110 mmol/L   CO2 27 20 - 32 mmol/L   Calcium 9.6 8.6 - 10.4 mg/dL   Total Protein 7.3 6.1 - 8.1 g/dL   Albumin 4.3 3.6 - 5.1 g/dL   Globulin 3.0 1.9 - 3.7 g/dL (calc)   AG Ratio 1.4 1.0 - 2.5 (calc)   Total Bilirubin 0.5 0.2 - 1.2 mg/dL   Alkaline phosphatase (APISO) 80 37 - 153 U/L   AST 15 10 - 35 U/L   ALT 21 6 - 29 U/L  VITAMIN D 25 Hydroxy (Vit-D Deficiency, Fractures)  Result Value Ref Range   Vit D, 25-Hydroxy 19 (L) 30 - 100 ng/mL  Thyroid Panel With TSH  Result Value Ref Range   T3 Uptake 31 22 - 35 %   T4, Total 7.7 5.1 - 11.9 mcg/dL   Free Thyroxine Index 2.4 1.4 - 3.8   TSH 0.38 (L) mIU/L  B12 and Folate Panel  Result Value Ref Range   Vitamin B-12 582 200 - 1,100 pg/mL   Folate 20.3 ng/mL  CBC w/Diff/Platelet  Result Value Ref Range   WBC 4.6 3.8 - 10.8 Thousand/uL   RBC 4.72 3.80 - 5.10 Million/uL   Hemoglobin 13.8 11.7 - 15.5 g/dL   HCT 40.9 35.0 - 45.0 %   MCV 86.7 80.0 - 100.0 fL   MCH 29.2 27.0 - 33.0 pg   MCHC 33.7 32.0 - 36.0 g/dL   RDW 13.1 11.0 - 15.0 %   Platelets 272 140 - 400 Thousand/uL   MPV 11.4 7.5 - 12.5 fL   Neutro Abs 2,148 1,500 - 7,800 cells/uL   Lymphs Abs 2,024 850 - 3,900 cells/uL   Absolute Monocytes 299 200 - 950 cells/uL   Eosinophils Absolute 69 15 - 500 cells/uL   Basophils Absolute 60 0 - 200 cells/uL   Neutrophils Relative % 46.7 %   Total Lymphocyte 44.0 %   Monocytes Relative 6.5 %   Eosinophils Relative 1.5 %   Basophils Relative 1.3 %      Assessment & Plan:   Problem List Items Addressed This Visit    None    Visit Diagnoses    Productive cough    -  Primary   Likely  bronchospasm.  Prednisone 20 mg daily for 5 days.  Albuterol 2 puffs q 6  hours prn but caution worsening palpitations   Relevant Orders   DG Chest 2 View (Completed)   COVID-19 virus infection       Relevant Orders   DG Chest 2 View (Completed)   Heart palpitations       EKG without change.  No Orthostatics.  Refer back to primary and consider another consult with Dr. Rockey Situ   Relevant Orders   EKG 12-Lead (Completed)       Follow up plan: Return if symptoms worsen or fail to improve.

## 2020-01-26 DIAGNOSIS — M65331 Trigger finger, right middle finger: Secondary | ICD-10-CM | POA: Insufficient documentation

## 2020-06-14 ENCOUNTER — Telehealth (INDEPENDENT_AMBULATORY_CARE_PROVIDER_SITE_OTHER): Payer: BLUE CROSS/BLUE SHIELD | Admitting: Family Medicine

## 2020-06-14 ENCOUNTER — Other Ambulatory Visit: Payer: Self-pay

## 2020-06-14 ENCOUNTER — Encounter: Payer: Self-pay | Admitting: Family Medicine

## 2020-06-14 VITALS — BP 125/81

## 2020-06-14 DIAGNOSIS — R42 Dizziness and giddiness: Secondary | ICD-10-CM

## 2020-06-14 MED ORDER — MECLIZINE HCL 25 MG PO TABS: 12.5000 mg | ORAL_TABLET | Freq: Three times a day (TID) | ORAL | 1 refills | Status: AC | PRN

## 2020-06-14 NOTE — Patient Instructions (Signed)
No driving until cleared   Vertigo Vertigo is the feeling that you or the things around you are moving when they are not. This feeling can come and go at any time. Vertigo often goes away on its own. This condition can be dangerous if it happens when you are doing activities like driving or working with machines. Your doctor will do tests to find the cause of your vertigo. These tests will also help your doctor decide on the best treatment for you. Follow these instructions at home: Eating and drinking      Drink enough fluid to keep your pee (urine) pale yellow.  Do not drink alcohol. Activity  Return to your normal activities as told by your doctor. Ask your doctor what activities are safe for you.  In the morning, first sit up on the side of the bed. When you feel okay, stand slowly while you hold onto something until you know that your balance is fine.  Move slowly. Avoid sudden body or head movements or certain positions, as told by your doctor.  Use a cane if you have trouble standing or walking.  Sit down right away if you feel dizzy.  Avoid doing any tasks or activities that can cause danger to you or others if you get dizzy.  Avoid bending down if you feel dizzy. Place items in your home so that they are easy for you to reach without leaning over.  Do not drive or use heavy machinery if you feel dizzy. General instructions  Take over-the-counter and prescription medicines only as told by your doctor.  Keep all follow-up visits as told by your doctor. This is important. Contact a doctor if:  Your medicine does not help your vertigo.  You have a fever.  Your problems get worse or you have new symptoms.  Your family or friends see changes in your behavior.  The feeling of being sick to your stomach gets worse.  Your vomiting gets worse.  You lose feeling (have numbness) in part of your body.  You feel prickling and tingling in a part of your body. Get help  right away if:  You have trouble moving or talking.  You are always dizzy.  You pass out (faint).  You get very bad headaches.  You feel weak in your hands, arms, or legs.  You have changes in your hearing.  You have changes in how you see (vision).  You get a stiff neck.  Bright light starts to bother you. Summary  Vertigo is the feeling that you or the things around you are moving when they are not.  Your doctor will do tests to find the cause of your vertigo.  You may be told to avoid some tasks, positions, or movements.  Contact a doctor if your medicine is not helping, or if you have a fever, new symptoms, or a change in behavior.  Get help right away if you get very bad headaches, or if you have changes in how you speak, hear, or see. This information is not intended to replace advice given to you by your health care provider. Make sure you discuss any questions you have with your health care provider. Document Revised: 05/25/2018 Document Reviewed: 05/25/2018 Elsevier Patient Education  2020 [employer].

## 2020-06-14 NOTE — Progress Notes (Signed)
Name: Amy May   MRN: [id number]    DOB: 1965-08-12   Date:06/14/2020       Progress Note  Subjective:    Chief Complaint  Chief Complaint  Patient presents with  . Dizziness    feels like room spinning, nausea,     I connected with  [name]  on 06/14/20 at  8:40 AM EST by a video enabled telemedicine application and verified that I am speaking with the correct person using two identifiers.  I discussed the limitations of evaluation and management by telemedicine and the availability of in person appointments. The patient expressed understanding and agreed to proceed. Staff also discussed with the patient that there may be a patient responsible charge related to this service. Patient Location: work Provider Location: [facility] Additional Individuals present: none  HPI [facility] Patient evaluated virtually today.  Being of visit today I did explain to her the extensive limitations on evaluating her symptoms virtually and I asked her to come into the office today so could examine her in person, I did explain the multiple things that cannot be thoroughly evaluated and examined such as central vertigo, stroke, cardiovascular causes, cannot do blood work or evaluate HEENT I also asked the patient if she can get a ride here and instructed her not to drive She stated that she had already driven herself to work and did fine she could not leave work or get a ride here, she verbalized understanding limitations of a virtual visit and did wish to proceed with discussing this only virtually today  Patient woke up and upon standing was dizzy she felt similar to being drunk difficulty maintaining balance, did become nauseous and fell into the wall in her home.  Shortly after this she tried to get up and go shower she did okay getting to the shower however when she bent over to to wash herself and stand back up she again had another episode. 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  The nurses there checked her blood pressure which was normal.  Patient denies any recent nasal congestion, allergies, ear pressure, headaches She has not had any associated facial droop, slurred speech, confusion, focal numbness tingling or weakness She denies any palpitations, chest pain, shortness of breath, visual disturbances or vision changes She denied it feeling like she is going to black out or pass out and stated it felt more like the room was spinning and she was dizzy She has had this in the past but not this bad  She is compliant with her blood pressure medication triamterene hydrochlorothiazide, she does have medication on her chart for seasonal allergies however she denies any nasal symptoms whatsoever.  Patient Active Problem List   Diagnosis Date Noted  . Low vitamin D level 07/14/2018  . Abnormal TSH 07/14/2018  . Paroxysmal tachycardia ([place]) 07/06/2018  . Abnormal EKG 07/05/2018  . Palpitations 07/05/2018  . Chronic right shoulder pain 04/30/2018  . Body aches 04/30/2018  . Essential hypertension 07/12/2015  . Class 1 obesity due to excess calories with serious comorbidity and body mass index (BMI) of 32.0 to 32.9 in adult 07/12/2015  . Hyperlipidemia 07/12/2015  . Female genuine stress incontinence 11/10/2013  . Acquired cyst of kidney 11/10/2013    Social History   Tobacco Use  . Smoking status: Never Smoker  . Smokeless tobacco: Never Used  Substance Use Topics  .  Alcohol use: No    Alcohol/week: 0.0 standard drinks     Current Outpatient Medications:  .  triamterene-hydrochlorothiazide (MAXZIDE-25) 37.5-25 MG tablet, Take 1 tablet by mouth daily., Disp: 90 tablet, Rfl: 3 .  albuterol (VENTOLIN HFA) 108 (90 Base) MCG/ACT inhaler, Inhale 2 puffs into the lungs every 6 (six) hours as needed for wheezing or shortness of breath. Or cough (Patient not taking: Reported on 06/14/2020), Disp: 18 g,  Rfl: 1 .  azelastine (ASTELIN) 0.1 % nasal spray, Place 1 spray into both nostrils 2 (two) times daily as needed for rhinitis or allergies. Use in each nostril as directed (Patient not taking: Reported on 08/25/2019), Disp: 30 mL, Rfl: 6 .  fluticasone (FLONASE) 50 MCG/ACT nasal spray, Place 2 sprays into both nostrils daily. (Patient not taking: Reported on 08/25/2019), Disp: 48 g, Rfl: 1 .  levocetirizine (XYZAL) 5 MG tablet, Take 1 tablet (5 mg total) by mouth daily. (Patient not taking: Reported on 08/25/2019), Disp: 90 tablet, Rfl: 1 .  predniSONE (DELTASONE) 20 MG tablet, Take 1 tablet (20 mg total) by mouth daily with breakfast. (Patient not taking: Reported on 06/14/2020), Disp: 5 tablet, Rfl: 0  No Known Allergies  I personally reviewed active problem list, medication list, allergies, family history, social history, health maintenance, notes from last encounter, lab results, imaging with the patient/caregiver today.   Review of Systems  10 Systems reviewed and are negative for acute change except as noted in the HPI.   Objective:   Virtual encounter, vitals limited, only able to obtain the following Today's Vitals   06/14/20 0817  BP: 125/81   There is no height or weight on file to calculate BMI. Nursing Note and Vital Signs reviewed.  Physical Exam Vitals and nursing note reviewed.  Neck:     Trachea: Phonation normal.  Pulmonary:     Effort: No respiratory distress.  Neurological:     Mental Status: She is alert.  Psychiatric:        Attention and Perception: Attention normal.        Mood and Affect: Mood normal.        Speech: Speech normal.        Behavior: Behavior normal. Behavior is cooperative.     PE limited by telephone encounter  No results found for this or any previous visit (from the past 72 hour(s)).  Assessment and Plan:     ICD-10-CM   1. Vertigo  R42 meclizine (ANTIVERT) 25 MG tablet  Differential for vertigo today includes BPPV, but cannot  exclude central vertigo, any HEENT etiology or cardiac etiology. Patient was instructed not to drive She was asked to come into clinic today so I could evaluate her in person but she stated she cannot do that We reviewed red flags the patient should immediately grab someone at the neurology office or call 9 one 1 month to evaluate her and she did verbalize understanding -Red flags include but not limited to focal weakness numbness tingling, facial droop, slurred speech, severe headache, visual disturbances, inability to maintain balance or severe nausea or vomiting with vertigo also any cardiac symptoms.  She was going to try and see if someone at her office could do orthostatics -although blood pressure done when she arrived working given to me today was in normal range and she denies any chest pain, palpitations, syncope, diaphoresis, exertional symptoms.  Although I would not like to do a vertigo evaluation over the phone or virtually I have explained all of  the limitation and options to the patient and proceed with this visit this way was her choice today.  I offered meclizine tablets to her, encouraged her not to drive, asked her to follow-up in clinic or if she needs urgent evaluation to go to urgent care or ER.  -Red flags and when to present for emergency care or RTC including chest pain, shortness of breath, new/worsening/un-resolving symptoms, reviewed with patient at time of visit. Follow up and care instructions discussed and provided in AVS. - I discussed the assessment and treatment plan with the patient. The patient was provided an opportunity to ask questions and all were answered. The patient agreed with the plan and demonstrated an understanding of the instructions.  I provided 25+ minutes of non-face-to-face time during this encounter.  Delsa Grana, PA-C 06/14/20 8:53 AM

## 2020-06-19 ENCOUNTER — Other Ambulatory Visit: Payer: Self-pay

## 2020-06-19 ENCOUNTER — Ambulatory Visit (INDEPENDENT_AMBULATORY_CARE_PROVIDER_SITE_OTHER): Payer: BLUE CROSS/BLUE SHIELD | Admitting: Family Medicine

## 2020-06-19 ENCOUNTER — Encounter: Payer: Self-pay | Admitting: Family Medicine

## 2020-06-19 VITALS — BP 122/78 | HR 91 | Temp 98.0°F | Resp 18 | Ht 62.0 in | Wt 202.2 lb

## 2020-06-19 DIAGNOSIS — J302 Other seasonal allergic rhinitis: Secondary | ICD-10-CM

## 2020-06-19 DIAGNOSIS — R7989 Other specified abnormal findings of blood chemistry: Secondary | ICD-10-CM

## 2020-06-19 DIAGNOSIS — M21611 Bunion of right foot: Secondary | ICD-10-CM | POA: Diagnosis not present

## 2020-06-19 DIAGNOSIS — M722 Plantar fascial fibromatosis: Secondary | ICD-10-CM | POA: Insufficient documentation

## 2020-06-19 DIAGNOSIS — Z131 Encounter for screening for diabetes mellitus: Secondary | ICD-10-CM

## 2020-06-19 DIAGNOSIS — I10 Essential (primary) hypertension: Secondary | ICD-10-CM

## 2020-06-19 DIAGNOSIS — H8110 Benign paroxysmal vertigo, unspecified ear: Secondary | ICD-10-CM | POA: Diagnosis not present

## 2020-06-19 DIAGNOSIS — R202 Paresthesia of skin: Secondary | ICD-10-CM | POA: Insufficient documentation

## 2020-06-19 DIAGNOSIS — E559 Vitamin D deficiency, unspecified: Secondary | ICD-10-CM

## 2020-06-19 DIAGNOSIS — M65331 Trigger finger, right middle finger: Secondary | ICD-10-CM

## 2020-06-19 DIAGNOSIS — E782 Mixed hyperlipidemia: Secondary | ICD-10-CM

## 2020-06-19 DIAGNOSIS — E538 Deficiency of other specified B group vitamins: Secondary | ICD-10-CM | POA: Insufficient documentation

## 2020-06-19 MED ORDER — LEVOCETIRIZINE DIHYDROCHLORIDE 5 MG PO TABS: 5.0000 mg | ORAL_TABLET | Freq: Every day | ORAL | 1 refills | Status: DC

## 2020-06-19 MED ORDER — TRIAMTERENE-HCTZ 37.5-25 MG PO TABS: 1.0000 | ORAL_TABLET | Freq: Every day | ORAL | 3 refills | Status: DC

## 2020-06-19 NOTE — Patient Instructions (Signed)

## 2020-06-19 NOTE — Progress Notes (Signed)
Name: Amy May   MRN: [id number]    DOB: May 13, 1966   Date:06/19/2020       Progress Note  Subjective  Chief Complaint  Medication Refill  HPI  HTN: she takes medication daily, bp is at goal, no chest pain, palpitation or sob  Abnormal TSH: advised to have labs rechecked today, it was suppressed, no hair loss, gaining weight  Morbid obesity: BMI is above 35 with co-morbidities, HTN, hyperlipidemia. Weight gain, since COVID-19  07/2019 , her sense of taste and smell and is on and off , she skips meals because she does not feel hungry. She is drinking coffee with creamer and sugar , 10 oz  Daily. Discussed eating calories instead of drinking calories   Vitamin D and B12 deficiency: discussed recheck levels  Hyperlipidemia: not on medication, we will recheck levels  The 10-year ASCVD risk score Mikey Bussing DC Jr., et al., 2013) is: 4.5%   Values used to calculate the score:     Age: 54 years     Sex: Female     Is Non-Hispanic African American: Yes     Diabetic: No     Tobacco smoker: No     Systolic Blood Pressure: 163 mmHg     Is BP treated: Yes     HDL Cholesterol: 46 mg/dL     Total Cholesterol: 178 mg/dL   Plantar fascitis: she wears flat shoes for work, discussed changing foot wear, we will write a note for work, also discussed stretching.   Patient Active Problem List   Diagnosis Date Noted  . Plantar fasciitis, bilateral 06/19/2020  . Vitamin D deficiency 06/19/2020  . B12 deficiency 06/19/2020  . Paresthesia of skin 06/19/2020  . Benign paroxysmal positional vertigo 06/19/2020  . Bunion, right 06/19/2020  . Acquired trigger finger of right middle finger 01/26/2020  . Low vitamin D level 07/14/2018  . Abnormal TSH 07/14/2018  . Paroxysmal tachycardia ([place]) 07/06/2018  . Abnormal EKG 07/05/2018  . Palpitations 07/05/2018  . Chronic right shoulder pain 04/30/2018  . Body aches 04/30/2018  . Essential hypertension 07/12/2015  . Class 1 obesity due to excess  calories with serious comorbidity and body mass index (BMI) of 32.0 to 32.9 in adult 07/12/2015  . Hyperlipidemia 07/12/2015  . Female genuine stress incontinence 11/10/2013  . Acquired cyst of kidney 11/10/2013    Past Surgical History:  Procedure Laterality Date  . COLONOSCOPY WITH PROPOFOL N/A 05/25/2018   Procedure: COLONOSCOPY WITH PROPOFOL;  Surgeon: Lin Landsman, MD;  Location: [facility] ENDOSCOPY;  Service: Gastroenterology;  Laterality: N/A;  . DILATION AND CURETTAGE OF UTERUS    . TUBAL LIGATION      Family History  Problem Relation Age of Onset  . Heart disease Maternal Aunt   . Breast cancer Neg Hx     Social History   Tobacco Use  . Smoking status: Never Smoker  . Smokeless tobacco: Never Used  Substance Use Topics  . Alcohol use: No    Alcohol/week: 0.0 standard drinks     Current Outpatient Medications:  .  meclizine (ANTIVERT) 25 MG tablet, Take 0.5-1 tablets (12.5-25 mg total) by mouth 3 (three) times daily as needed for dizziness., Disp: 20 tablet, Rfl: 1 .  triamterene-hydrochlorothiazide (MAXZIDE-25) 37.5-25 MG tablet, Take 1 tablet by mouth daily., Disp: 90 tablet, Rfl: 3 .  azelastine (ASTELIN) 0.1 % nasal spray, Place 1 spray into both nostrils 2 (two) times daily as needed for rhinitis or allergies. Use in each nostril  as directed (Patient not taking: Reported on 08/25/2019), Disp: 30 mL, Rfl: 6 .  fluticasone (FLONASE) 50 MCG/ACT nasal spray, Place 2 sprays into both nostrils daily. (Patient not taking: Reported on 08/25/2019), Disp: 48 g, Rfl: 1 .  levocetirizine (XYZAL) 5 MG tablet, Take 1 tablet (5 mg total) by mouth daily., Disp: 90 tablet, Rfl: 1  No Known Allergies  I personally reviewed active problem list, medication list, allergies, family history, social history, health maintenance with the patient/caregiver today.   ROS  Constitutional: Negative for fever, positive for weight change.  Respiratory: Negative for cough and shortness of  breath.   Cardiovascular: Negative for chest pain or palpitations.  Gastrointestinal: Negative for abdominal pain, no bowel changes.  Musculoskeletal: positive  for gait problem or joint swelling.  Skin: Negative for rash.  Neurological: Negative for dizziness or headache.  No other specific complaints in a complete review of systems (except as listed in HPI above).  Objective  Vitals:   06/19/20 1036  BP: 122/78  Pulse: 91  Resp: 18  Temp: 98 F (36.7 C)  TempSrc: Oral  SpO2: 98%  Weight: 202 lb 3.2 oz (91.7 kg)  Height: 5\' 2"  (1.575 m)    Body mass index is 36.98 kg/m.  Physical Exam  Constitutional: Patient appears well-developed and well-nourished. Obese No distress.  HEENT: head atraumatic, normocephalic, pupils equal and reactive to light, neck supple Cardiovascular: Normal rate, regular rhythm and normal heart sounds.  No murmur heard. No BLE edema. Pulmonary/Chest: Effort normal and breath sounds normal. No respiratory distress. Muscular Skeletal: tender during palpation of both heels , no tenderness during palpation of toe, bunion on right toe Abdominal: Soft.  There is no tenderness. Psychiatric: Patient has a normal mood and affect. behavior is normal. Judgment and thought content normal.  PHQ2/9: Depression screen [facility] 2/9 06/19/2020 06/14/2020 08/24/2019 08/11/2019 06/24/2019  Decreased Interest 0 0 0 0 0  Down, Depressed, Hopeless 0 0 0 0 0  PHQ - 2 Score 0 0 0 0 0  Altered sleeping - - 0 0 0  Tired, decreased energy - - 0 0 0  Change in appetite - - 0 0 0  Feeling bad or failure about yourself  - - 0 0 0  Trouble concentrating - - 0 0 0  Moving slowly or fidgety/restless - - 0 0 0  Suicidal thoughts - - 0 0 0  PHQ-9 Score - - 0 0 0  Difficult doing work/chores - - Not difficult at all Not difficult at all Not difficult at all    phq 9 is negative   Fall Risk: Fall Risk  06/19/2020 06/14/2020 08/24/2019 08/11/2019 06/24/2019  Falls in the past year? 0 0 0 0  0  Number falls in past yr: 0 0 0 0 0  Injury with Fall? 0 0 0 0 0  Follow up - Falls evaluation completed Falls evaluation completed Falls evaluation completed Falls evaluation completed    Functional Status Survey: Is the patient deaf or have difficulty hearing?: No Does the patient have difficulty seeing, even when wearing glasses/contacts?: No Does the patient have difficulty concentrating, remembering, or making decisions?: No Does the patient have difficulty walking or climbing stairs?: No Does the patient have difficulty dressing or bathing?: No Does the patient have difficulty doing errands alone such as visiting a doctor's office or shopping?: No    Assessment & Plan   1. Bunion, right   2. Benign paroxysmal positional vertigo, unspecified laterality  Discussed  Epley maneuver   3. Acquired trigger finger of right middle finger   4. Paresthesia of skin  Reassurance given  5. Essential hypertension  - triamterene-hydrochlorothiazide (MAXZIDE-25) 37.5-25 MG tablet; Take 1 tablet by mouth daily.  Dispense: 90 tablet; Refill: 3 - CBC with Differential/Platelet - COMPLETE METABOLIC PANEL WITH GFR  6. B12 deficiency  - B12 and Folate Panel  7. Vitamin D deficiency  - VITAMIN D 25 Hydroxy (Vit-D Deficiency, Fractures)  8. Plantar fasciitis, bilateral  Note for work to wear tennis shoes   9. Seasonal allergic rhinitis, unspecified trigger  - levocetirizine (XYZAL) 5 MG tablet; Take 1 tablet (5 mg total) by mouth daily.  Dispense: 90 tablet; Refill: 1  10. Low TSH level  - Thyroid Panel With TSH  11. Diabetes mellitus screening  - Hemoglobin A1c  12. Mixed hyperlipidemia  - Lipid panel

## 2020-06-20 ENCOUNTER — Encounter: Payer: Self-pay | Admitting: Family Medicine

## 2020-06-20 LAB — COMPLETE METABOLIC PANEL WITH GFR
AG Ratio: 1.5 (calc) (ref 1.0–2.5)
ALT: 26 U/L (ref 6–29)
AST: 17 U/L (ref 10–35)
Albumin: 4.5 g/dL (ref 3.6–5.1)
Alkaline phosphatase (APISO): 84 U/L (ref 37–153)
BUN: 13 mg/dL (ref 7–25)
CO2: 29 mmol/L (ref 20–32)
Calcium: 10.2 mg/dL (ref 8.6–10.4)
Chloride: 102 mmol/L (ref 98–110)
Creat: 0.95 mg/dL (ref 0.50–1.05)
GFR, Est African American: 78 mL/min/{1.73_m2} (ref >=60)
GFR, Est Non African American: 67 mL/min/{1.73_m2} (ref >=60)
Globulin: 3.1 g/dL (calc) (ref 1.9–3.7)
Glucose, Bld: 82 mg/dL (ref 65–99)
Potassium: 4.3 mmol/L (ref 3.5–5.3)
Sodium: 140 mmol/L (ref 135–146)
Total Bilirubin: 0.4 mg/dL (ref 0.2–1.2)
Total Protein: 7.6 g/dL (ref 6.1–8.1)

## 2020-06-20 LAB — CBC WITH DIFFERENTIAL/PLATELET
Absolute Monocytes: 313 cells/uL (ref 200–950)
Basophils Absolute: 53 cells/uL (ref 0–200)
Basophils Relative: 1 %
Eosinophils Absolute: 122 cells/uL (ref 15–500)
Eosinophils Relative: 2.3 %
HCT: 40.8 % (ref 35.0–45.0)
Hemoglobin: 14.2 g/dL (ref 11.7–15.5)
Lymphs Abs: 2772 cells/uL (ref 850–3900)
MCH: 29.3 pg (ref 27.0–33.0)
MCHC: 34.8 g/dL (ref 32.0–36.0)
MCV: 84.3 fL (ref 80.0–100.0)
MPV: 11.7 fL (ref 7.5–12.5)
Monocytes Relative: 5.9 %
Neutro Abs: 2041 cells/uL (ref 1500–7800)
Neutrophils Relative %: 38.5 %
Platelets: 293 10*3/uL (ref 140–400)
RBC: 4.84 10*6/uL (ref 3.80–5.10)
RDW: 12.4 % (ref 11.0–15.0)
Total Lymphocyte: 52.3 %
WBC: 5.3 10*3/uL (ref 3.8–10.8)

## 2020-06-20 LAB — VITAMIN D 25 HYDROXY (VIT D DEFICIENCY, FRACTURES): Vit D, 25-Hydroxy: 28 ng/mL — ABNORMAL LOW (ref 30–100)

## 2020-06-20 LAB — HEMOGLOBIN A1C
Hgb A1c MFr Bld: 6 % of total Hgb — ABNORMAL HIGH (ref <5.7)
Mean Plasma Glucose: 126 mg/dL
eAG (mmol/L): 7 mmol/L

## 2020-06-20 LAB — LIPID PANEL
Cholesterol: 173 mg/dL (ref <200)
HDL: 48 mg/dL — ABNORMAL LOW (ref >=50)
LDL Cholesterol (Calc): 99 mg/dL (calc)
Non-HDL Cholesterol (Calc): 125 mg/dL (calc) (ref <130)
Total CHOL/HDL Ratio: 3.6 (calc) (ref <5.0)
Triglycerides: 166 mg/dL — ABNORMAL HIGH (ref <150)

## 2020-06-20 LAB — THYROID PANEL WITH TSH
Free Thyroxine Index: 2.1 (ref 1.4–3.8)
T3 Uptake: 28 % (ref 22–35)
T4, Total: 7.5 ug/dL (ref 5.1–11.9)
TSH: 0.93 mIU/L

## 2020-06-20 LAB — B12 AND FOLATE PANEL
Folate: 14.4 ng/mL
Vitamin B-12: 550 pg/mL (ref 200–1100)

## 2020-08-28 ENCOUNTER — Other Ambulatory Visit: Payer: Self-pay

## 2020-08-28 DIAGNOSIS — Z1231 Encounter for screening mammogram for malignant neoplasm of breast: Secondary | ICD-10-CM

## 2020-08-28 DIAGNOSIS — Z13820 Encounter for screening for osteoporosis: Secondary | ICD-10-CM

## 2020-08-31 ENCOUNTER — Telehealth: Payer: Self-pay | Admitting: Emergency Medicine

## 2020-08-31 NOTE — Telephone Encounter (Signed)
Would like order for xray for both knees and left  shoulder. Had discussed at appointment in December

## 2020-09-01 NOTE — Telephone Encounter (Signed)
Patient notified

## 2020-09-13 NOTE — Progress Notes (Signed)
Name: Amy May   MRN: [id number]    DOB: Nov 06, 1965   Date:09/14/2020       Progress Note  Subjective  Chief Complaint  Shoulder/Knee Pain  I connected with  [name] on 09/14/20 at 11:20 AM EST by telephone and verified that I am speaking with the correct person using two identifiers.  I discussed the limitations, risks, security and privacy concerns of performing an evaluation and management service by telephone and the availability of in person appointments. Staff also discussed with the patient that there may be a patient responsible charge related to this service. Patient agreed on having a virtual visit  Patient Location: at work  Provider Location: [facility] Additional Individuals present: alone   HPI  Left Shoulder: she states symptoms started years ago, she thought initially secondary to an injury, but over the past few months the pain is constant, has decrease rom ( limitation with internal rotation) also with abduction , pain when sleeping when she sleeps on left lateral decubitus. She is afraid to lift anything over 10 lbs on left shoulder. She has not seen ortho.  Denies neck pain, but states sometimes left arm is numb when she wakes  up  Bilateral knee pain: described as aching, worse when going up and down stairs She has to hold on to the rails because she is afraid it will give out. No falls. Denies effusion, redness or increase in warmth    Patient Active Problem List   Diagnosis Date Noted  . Plantar fasciitis, bilateral 06/19/2020  . Vitamin D deficiency 06/19/2020  . B12 deficiency 06/19/2020  . Paresthesia of skin 06/19/2020  . Benign paroxysmal positional vertigo 06/19/2020  . Bunion, right 06/19/2020  . Acquired trigger finger of right middle finger 01/26/2020  . Low vitamin D level 07/14/2018  . Abnormal TSH 07/14/2018  . Paroxysmal tachycardia ([place]) 07/06/2018  . Abnormal EKG 07/05/2018  . Palpitations 07/05/2018  . Chronic right shoulder pain  04/30/2018  . Body aches 04/30/2018  . Essential hypertension 07/12/2015  . Class 1 obesity due to excess calories with serious comorbidity and body mass index (BMI) of 32.0 to 32.9 in adult 07/12/2015  . Hyperlipidemia 07/12/2015  . Female genuine stress incontinence 11/10/2013  . Acquired cyst of kidney 11/10/2013    Past Surgical History:  Procedure Laterality Date  . COLONOSCOPY WITH PROPOFOL N/A 05/25/2018   Procedure: COLONOSCOPY WITH PROPOFOL;  Surgeon: Lin Landsman, MD;  Location: [facility] ENDOSCOPY;  Service: Gastroenterology;  Laterality: N/A;  . DILATION AND CURETTAGE OF UTERUS    . TUBAL LIGATION      Family History  Problem Relation Age of Onset  . Heart disease Maternal Aunt   . Breast cancer Neg Hx      Current Outpatient Medications:  .  levocetirizine (XYZAL) 5 MG tablet, Take 1 tablet (5 mg total) by mouth daily., Disp: 90 tablet, Rfl: 1 .  triamterene-hydrochlorothiazide (MAXZIDE-25) 37.5-25 MG tablet, Take 1 tablet by mouth daily., Disp: 90 tablet, Rfl: 3 .  azelastine (ASTELIN) 0.1 % nasal spray, Place 1 spray into both nostrils 2 (two) times daily as needed for rhinitis or allergies. Use in each nostril as directed (Patient not taking: No sig reported), Disp: 30 mL, Rfl: 6 .  fluticasone (FLONASE) 50 MCG/ACT nasal spray, Place 2 sprays into both nostrils daily. (Patient not taking: No sig reported), Disp: 48 g, Rfl: 1 .  meclizine (ANTIVERT) 25 MG tablet, Take 0.5-1 tablets (12.5-25 mg total) by  mouth 3 (three) times daily as needed for dizziness. (Patient not taking: Reported on 09/14/2020), Disp: 20 tablet, Rfl: 1  No Known Allergies  I personally reviewed active problem list, medication list, allergies, family history, social history, health maintenance with the patient/caregiver today.   ROS  Ten systems reviewed and is negative except as mentioned in HPI   Objective  Virtual encounter, vitals not obtained.  Body mass index is 37.13  kg/m.  Physical Exam  Awake, alert and oriented   PHQ2/9: Depression screen [facility] 2/9 09/14/2020 06/19/2020 06/14/2020 08/24/2019 08/11/2019  Decreased Interest 0 0 0 0 0  Down, Depressed, Hopeless 0 0 0 0 0  PHQ - 2 Score 0 0 0 0 0  Altered sleeping - - - 0 0  Tired, decreased energy - - - 0 0  Change in appetite - - - 0 0  Feeling bad or failure about yourself  - - - 0 0  Trouble concentrating - - - 0 0  Moving slowly or fidgety/restless - - - 0 0  Suicidal thoughts - - - 0 0  PHQ-9 Score - - - 0 0  Difficult doing work/chores - - - Not difficult at all Not difficult at all   PHQ-2/9 Result is negative.    Fall Risk: Fall Risk  09/14/2020 06/19/2020 06/14/2020 08/24/2019 08/11/2019  Falls in the past year? 0 0 0 0 0  Number falls in past yr: 0 0 0 0 0  Injury with Fall? 0 0 0 0 0  Follow up Falls evaluation completed - Falls evaluation completed Falls evaluation completed Falls evaluation completed     Assessment & Plan  1. Impingement syndrome of shoulder, left  - meloxicam (MOBIC) 15 MG tablet; Take 1 tablet (15 mg total) by mouth daily.  Dispense: 90 tablet; Refill: 0 - Ambulatory referral to Orthopedic Surgery  2. Chronic pain of both knees  - meloxicam (MOBIC) 15 MG tablet; Take 1 tablet (15 mg total) by mouth daily.  Dispense: 90 tablet; Refill: 0 - Ambulatory referral to Orthopedic Surgery  I discussed the assessment and treatment plan with the patient. The patient was provided an opportunity to ask questions and all were answered. The patient agreed with the plan and demonstrated an understanding of the instructions.   The patient was advised to call back or seek an in-person evaluation if the symptoms worsen or if the condition fails to improve as anticipated.  I provided 25  minutes of non-face-to-face time during this encounter.  Steele Sizer, MD

## 2020-09-14 ENCOUNTER — Telehealth (INDEPENDENT_AMBULATORY_CARE_PROVIDER_SITE_OTHER): Payer: Managed Care, Other (non HMO) | Admitting: Family Medicine

## 2020-09-14 ENCOUNTER — Other Ambulatory Visit: Payer: Self-pay

## 2020-09-14 ENCOUNTER — Encounter: Payer: Self-pay | Admitting: Family Medicine

## 2020-09-14 VITALS — Ht 62.0 in | Wt 203.0 lb

## 2020-09-14 DIAGNOSIS — G8929 Other chronic pain: Secondary | ICD-10-CM | POA: Diagnosis not present

## 2020-09-14 DIAGNOSIS — M25561 Pain in right knee: Secondary | ICD-10-CM

## 2020-09-14 DIAGNOSIS — M7542 Impingement syndrome of left shoulder: Secondary | ICD-10-CM

## 2020-09-14 DIAGNOSIS — M25562 Pain in left knee: Secondary | ICD-10-CM | POA: Diagnosis not present

## 2020-09-14 MED ORDER — MELOXICAM 15 MG PO TABS: 15.0000 mg | ORAL_TABLET | Freq: Every day | ORAL | 0 refills | Status: DC

## 2020-09-19 ENCOUNTER — Ambulatory Visit
Admission: RE | Admit: 2020-09-19 | Discharge: 2020-09-19 | Disposition: A | Discharge status: Home / Self Care | Payer: Managed Care, Other (non HMO) | Source: Ambulatory Visit | Attending: Family Medicine | Admitting: Family Medicine

## 2020-09-19 ENCOUNTER — Other Ambulatory Visit: Payer: Self-pay

## 2020-09-19 DIAGNOSIS — Z13820 Encounter for screening for osteoporosis: Secondary | ICD-10-CM

## 2020-09-19 DIAGNOSIS — Z1231 Encounter for screening mammogram for malignant neoplasm of breast: Secondary | ICD-10-CM | POA: Insufficient documentation

## 2020-09-21 ENCOUNTER — Encounter: Payer: Self-pay | Admitting: Family Medicine

## 2020-09-21 DIAGNOSIS — M810 Age-related osteoporosis without current pathological fracture: Secondary | ICD-10-CM | POA: Insufficient documentation

## 2020-09-21 DIAGNOSIS — M816 Localized osteoporosis [Lequesne]: Secondary | ICD-10-CM | POA: Insufficient documentation

## 2020-10-12 NOTE — Progress Notes (Signed)
Name: Amy May   MRN: [id number]    DOB: 1965-09-25   Date:10/13/2020       Progress Note  Subjective  Chief Complaint  Annual Exam   HPI  Patient presents for annual CPE.  Osteoporosis: diagnosed on last bone density, she increased calcium intake, taking vitamin D 2000 units only once a week. Reached Menopause at age 55. She states she just started going to the gym this week.   Diet: she has been increasing calcium on her diet, healthier snacks Exercise: she is going to the gym   [place] Video Visit from 09/14/2020 in [facility]  AUDIT-C Score 0     Depression: Phq 9 is  negative Depression screen [facility] 2/9 10/13/2020 09/14/2020 06/19/2020 06/14/2020 08/24/2019  Decreased Interest 0 0 0 0 0  Down, Depressed, Hopeless 0 0 0 0 0  PHQ - 2 Score 0 0 0 0 0  Altered sleeping - - - - 0  Tired, decreased energy - - - - 0  Change in appetite - - - - 0  Feeling bad or failure about yourself  - - - - 0  Trouble concentrating - - - - 0  Moving slowly or fidgety/restless - - - - 0  Suicidal thoughts - - - - 0  PHQ-9 Score - - - - 0  Difficult doing work/chores - - - - Not difficult at all  Some recent data might be hidden   Hypertension: BP Readings from Last 3 Encounters:  10/13/20 122/82  06/19/20 122/78  06/14/20 125/81   Obesity: Wt Readings from Last 3 Encounters:  10/13/20 200 lb (90.7 kg)  09/14/20 203 lb (92.1 kg)  06/19/20 202 lb 3.2 oz (91.7 kg)   BMI Readings from Last 3 Encounters:  10/13/20 36.58 kg/m  09/14/20 37.13 kg/m  06/19/20 36.98 kg/m     Vaccines:   Shingrix: 55-64 yo and ask insurance if covered when patient above 76 yo Pneumonia: N/A educated and discussed with patient. Flu: 05/12/2020 educated and discussed with patient.  Hep C Screening: 06/17/2018 STD testing and prevention (HIV/chl/gon/syphilis): 04/30/2018 Intimate partner violence: negative  Sexual History : seldom, same partner for the past 15 years  Menstrual  History/LMP/Abnormal Bleeding:  Incontinence Symptoms:   Breast cancer:  - Last Mammogram: 09/19/2020 - BRCA gene screening: N/A  Osteoporosis: Discussed high calcium and vitamin D supplementation, weight bearing exercises, we will start medications, discussed options, including Alendronate, Prolia, injectable biphosphanates and forteo  Cervical cancer screening: 06/17/2018  Skin cancer: Discussed monitoring for atypical lesions  Colorectal cancer: 05/25/2018  Lung cancer:  Low Dose CT Chest recommended if Age 19-80 years, 20 pack-year currently smoking OR have quit w/in 15years. Patient does not qualify.   ECG: 08/25/2019  Advanced Care Planning: A voluntary discussion about advance care planning including the explanation and discussion of advance directives.  Discussed health care proxy and Living will, and the patient was able to identify a health care proxy as Erin    Lipids: Lab Results  Component Value Date   CHOL 173 06/19/2020   CHOL 178 06/24/2019   CHOL 182 06/17/2018   Lab Results  Component Value Date   HDL 48 (L) 06/19/2020   HDL 46 (L) 06/24/2019   HDL 51 06/17/2018   Lab Results  Component Value Date   LDLCALC 99 06/19/2020   LDLCALC 104 (H) 06/24/2019   LDLCALC 110 (H) 06/17/2018   Lab Results  Component Value Date   TRIG  166 (H) 06/19/2020   TRIG 165 (H) 06/24/2019   TRIG 107 06/17/2018   Lab Results  Component Value Date   CHOLHDL 3.6 06/19/2020   CHOLHDL 3.9 06/24/2019   CHOLHDL 3.6 06/17/2018   No results found for: LDLDIRECT  Glucose: Glucose, Bld  Date Value Ref Range Status  06/19/2020 82 65 - 99 mg/dL Final    Comment:    .            Fasting reference interval .   06/24/2019 97 65 - 99 mg/dL Final    Comment:    .            Fasting reference interval .   06/17/2018 88 65 - 99 mg/dL Final    Comment:    .            Fasting reference interval .     Patient Active Problem List   Diagnosis Date Noted  . Osteoporosis of  lumbar spine 09/21/2020  . Plantar fasciitis, bilateral 06/19/2020  . Vitamin D deficiency 06/19/2020  . B12 deficiency 06/19/2020  . Paresthesia of skin 06/19/2020  . Benign paroxysmal positional vertigo 06/19/2020  . Bunion, right 06/19/2020  . Acquired trigger finger of right middle finger 01/26/2020  . Low vitamin D level 07/14/2018  . Abnormal TSH 07/14/2018  . Paroxysmal tachycardia ([place]) 07/06/2018  . Abnormal EKG 07/05/2018  . Palpitations 07/05/2018  . Chronic right shoulder pain 04/30/2018  . Body aches 04/30/2018  . Essential hypertension 07/12/2015  . Class 1 obesity due to excess calories with serious comorbidity and body mass index (BMI) of 32.0 to 32.9 in adult 07/12/2015  . Hyperlipidemia 07/12/2015  . Female genuine stress incontinence 11/10/2013  . Acquired cyst of kidney 11/10/2013    Past Surgical History:  Procedure Laterality Date  . COLONOSCOPY WITH PROPOFOL N/A 05/25/2018   Procedure: COLONOSCOPY WITH PROPOFOL;  Surgeon: Lin Landsman, MD;  Location: [facility] ENDOSCOPY;  Service: Gastroenterology;  Laterality: N/A;  . DILATION AND CURETTAGE OF UTERUS    . TUBAL LIGATION      Family History  Problem Relation Age of Onset  . Heart disease Maternal Aunt   . Breast cancer Neg Hx     Social History   Socioeconomic History  . Marital status: Divorced    Spouse name: Not on file  . Number of children: 2  . Years of education: Not on file  . Highest education level: Not on file  Occupational History  . Occupation: Retail buyer: [employer]  Tobacco Use  . Smoking status: Never Smoker  . Smokeless tobacco: Never Used  Vaping Use  . Vaping Use: Never used  Substance and Sexual Activity  . Alcohol use: No    Alcohol/week: 0.0 standard drinks  . Drug use: No  . Sexual activity: Not Currently    Partners: Male  Other Topics Concern  . Not on file  Social History Narrative  . Not on file   Social Determinants of Health   Financial  Resource Strain: Low Risk   . Difficulty of Paying Living Expenses: Not hard at all  Food Insecurity: No Food Insecurity  . Worried About Charity fundraiser in the Last Year: Never true  . Ran Out of Food in the Last Year: Never true  Transportation Needs: No Transportation Needs  . Lack of Transportation (Medical): No  . Lack of Transportation (Non-Medical): No  Physical Activity: Sufficiently Active  . Days of Exercise per  Week: 4 days  . Minutes of Exercise per Session: 60 min  Stress: No Stress Concern Present  . Feeling of Stress : Not at all  Social Connections: Moderately Integrated  . Frequency of Communication with Friends and Family: More than three times a week  . Frequency of Social Gatherings with Friends and Family: More than three times a week  . Attends Religious Services: More than 4 times per year  . Active Member of Clubs or Organizations: Yes  . Attends Archivist Meetings: More than 4 times per year  . Marital Status: Divorced  Human resources officer Violence: Not At Risk  . Fear of Current or Ex-Partner: No  . Emotionally Abused: No  . Physically Abused: No  . Sexually Abused: No     Current Outpatient Medications:  .  levocetirizine (XYZAL) 5 MG tablet, Take 1 tablet (5 mg total) by mouth daily., Disp: 90 tablet, Rfl: 1 .  meloxicam (MOBIC) 15 MG tablet, Take 1 tablet (15 mg total) by mouth daily., Disp: 90 tablet, Rfl: 0 .  Teriparatide, Recombinant, (FORTEO) 620 MCG/2.48ML SOPN, Inject 20 mcg into the skin daily., Disp: 7.44 mL, Rfl: 3 .  triamterene-hydrochlorothiazide (MAXZIDE-25) 37.5-25 MG tablet, Take 1 tablet by mouth daily., Disp: 90 tablet, Rfl: 3 .  azelastine (ASTELIN) 0.1 % nasal spray, Place 1 spray into both nostrils 2 (two) times daily as needed for rhinitis or allergies. Use in each nostril as directed (Patient not taking: No sig reported), Disp: 30 mL, Rfl: 6 .  fluticasone (FLONASE) 50 MCG/ACT nasal spray, Place 2 sprays into both  nostrils daily. (Patient not taking: No sig reported), Disp: 48 g, Rfl: 1 .  meclizine (ANTIVERT) 25 MG tablet, Take 0.5-1 tablets (12.5-25 mg total) by mouth 3 (three) times daily as needed for dizziness. (Patient not taking: No sig reported), Disp: 20 tablet, Rfl: 1  No Known Allergies   ROS  Constitutional: Negative for fever or weight change.  Respiratory: Negative for cough and shortness of breath.   Cardiovascular: Negative for chest pain or palpitations.  Gastrointestinal: Negative for abdominal pain, no bowel changes.  Musculoskeletal: Negative for gait problem or joint swelling.  Skin: Negative for rash.  Neurological: Negative for dizziness or headache.  No other specific complaints in a complete review of systems (except as listed in HPI above).  Objective  Vitals:   10/13/20 0758  BP: 122/82  Pulse: 81  Resp: 16  Temp: 98 F (36.7 C)  TempSrc: Oral  SpO2: 99%  Weight: 200 lb (90.7 kg)  Height: 5' 2" (1.575 m)    Body mass index is 36.58 kg/m.  Physical Exam  Constitutional: Patient appears well-developed and well-nourished. No distress.  HENT: Head: Normocephalic and atraumatic. Ears: B TMs ok, no erythema or effusion; Nose: Not done. Mouth/Throat: not done   Eyes: Conjunctivae and EOM are normal. Pupils are equal, round, and reactive to light. No scleral icterus.  Neck: Normal range of motion. Neck supple. No JVD present. No thyromegaly present.  Cardiovascular: Normal rate, regular rhythm and normal heart sounds.  No murmur heard. No BLE edema. Pulmonary/Chest: Effort normal and breath sounds normal. No respiratory distress. Abdominal: Soft. Bowel sounds are normal, no distension. There is no tenderness. no masses Breast: lumpy on inner quadrants, symmetrical,  no nipple discharge or rashes FEMALE GENITALIA:  Not done  RECTAL: not done  Musculoskeletal: Normal range of motion, no joint effusions. No gross deformities Neurological: he is alert and  oriented to person, place,  and time. No cranial nerve deficit. Coordination, balance, strength, speech and gait are normal.  Skin: Skin is warm and dry. No rash noted. No erythema.  Psychiatric: Patient has a normal mood and affect. behavior is normal. Judgment and thought content normal.  Fall Risk: Fall Risk  10/13/2020 09/14/2020 06/19/2020 06/14/2020 08/24/2019  Falls in the past year? 0 0 0 0 0  Number falls in past yr: 0 0 0 0 0  Injury with Fall? 0 0 0 0 0  Follow up - Falls evaluation completed - Falls evaluation completed Falls evaluation completed     Functional Status Survey: Is the patient deaf or have difficulty hearing?: No Does the patient have difficulty seeing, even when wearing glasses/contacts?: No Does the patient have difficulty concentrating, remembering, or making decisions?: No Does the patient have difficulty walking or climbing stairs?: Yes Does the patient have difficulty dressing or bathing?: No Does the patient have difficulty doing errands alone such as visiting a doctor's office or shopping?: No   Assessment & Plan  1. Well adult exam  She had Tdap in 2019   2. Localized osteoporosis without current pathological fracture  - Teriparatide, Recombinant, (FORTEO) 620 MCG/2.48ML SOPN; Inject 20 mcg into the skin daily.  Dispense: 7.44 mL; Refill: 3 - Vitamin D, Ergocalciferol, (DRISDOL) 1.25 MG (50000 UNIT) CAPS capsule; Take 1 capsule (50,000 Units total) by mouth every 7 (seven) days.  Dispense: 12 capsule; Refill: 1  3. Vitamin D deficiency  - Vitamin D, Ergocalciferol, (DRISDOL) 1.25 MG (50000 UNIT) CAPS capsule; Take 1 capsule (50,000 Units total) by mouth every 7 (seven) days.  Dispense: 12 capsule; Refill: 1  -USPSTF grade A and B recommendations reviewed with patient; age-appropriate recommendations, preventive care, screening tests, etc discussed and encouraged; healthy living encouraged; see AVS for patient education given to patient -Discussed  importance of 150 minutes of physical activity weekly, eat two servings of fish weekly, eat one serving of tree nuts ( cashews, pistachios, pecans, almonds.Marland Kitchen) every other day, eat 6 servings of fruit/vegetables daily and drink plenty of water and avoid sweet beverages.

## 2020-10-13 ENCOUNTER — Ambulatory Visit (INDEPENDENT_AMBULATORY_CARE_PROVIDER_SITE_OTHER): Payer: Managed Care, Other (non HMO) | Admitting: Family Medicine

## 2020-10-13 ENCOUNTER — Telehealth: Payer: Self-pay | Admitting: Emergency Medicine

## 2020-10-13 ENCOUNTER — Encounter: Payer: Self-pay | Admitting: Family Medicine

## 2020-10-13 ENCOUNTER — Other Ambulatory Visit: Payer: Self-pay

## 2020-10-13 VITALS — BP 122/82 | HR 81 | Temp 98.0°F | Resp 16 | Ht 62.0 in | Wt 200.0 lb

## 2020-10-13 DIAGNOSIS — Z0000 Encounter for general adult medical examination without abnormal findings: Secondary | ICD-10-CM

## 2020-10-13 DIAGNOSIS — M816 Localized osteoporosis [Lequesne]: Secondary | ICD-10-CM

## 2020-10-13 DIAGNOSIS — E559 Vitamin D deficiency, unspecified: Secondary | ICD-10-CM

## 2020-10-13 MED ORDER — TERIPARATIDE 620 MCG/2.48ML ~~LOC~~ SOPN: 20.0000 ug | PEN_INJECTOR | Freq: Every day | SUBCUTANEOUS | 3 refills | Status: DC

## 2020-10-13 MED ORDER — VITAMIN D (ERGOCALCIFEROL) 1.25 MG (50000 UNIT) PO CAPS: 50000.0000 [IU] | ORAL_CAPSULE | ORAL | 1 refills | Status: DC

## 2020-10-13 NOTE — Patient Instructions (Signed)
Preventive Care 78-56 Years Old, Female Preventive care refers to lifestyle choices and visits with your health care provider that can promote health and wellness. This includes:  A yearly physical exam. This is also called an annual wellness visit.  Regular dental and eye exams.  Immunizations.  Screening for certain conditions.  Healthy lifestyle choices, such as: ? Eating a healthy diet. ? Getting regular exercise. ? Not using drugs or products that contain nicotine and tobacco. ? Limiting alcohol use. What can I expect for my preventive care visit? Physical exam Your health care provider will check your:  Height and weight. These may be used to calculate your BMI (body mass index). BMI is a measurement that tells if you are at a healthy weight.  Heart rate and blood pressure.  Body temperature.  Skin for abnormal spots. Counseling Your health care provider may ask you questions about your:  Past medical problems.  Family's medical history.  Alcohol, tobacco, and drug use.  Emotional well-being.  Home life and relationship well-being.  Sexual activity.  Diet, exercise, and sleep habits.  Work and work Statistician.  Access to firearms.  Method of birth control.  Menstrual cycle.  Pregnancy history. What immunizations do I need? Vaccines are usually given at various ages, according to a schedule. Your health care provider will recommend vaccines for you based on your age, medical history, and lifestyle or other factors, such as travel or where you work.   What tests do I need? Blood tests  Lipid and cholesterol levels. These may be checked every 5 years, or more often if you are over 61 years old.  Hepatitis C test.  Hepatitis B test. Screening  Lung cancer screening. You may have this screening every year starting at age 63 if you have a 30-pack-year history of smoking and currently smoke or have quit within the past 15 years.  Colorectal cancer  screening. ? All adults should have this screening starting at age 68 and continuing until age 104. ? Your health care provider may recommend screening at age 50 if you are at increased risk. ? You will have tests every 1-10 years, depending on your results and the type of screening test.  Diabetes screening. ? This is done by checking your blood sugar (glucose) after you have not eaten for a while (fasting). ? You may have this done every 1-3 years.  Mammogram. ? This may be done every 1-2 years. ? Talk with your health care provider about when you should start having regular mammograms. This may depend on whether you have a family history of breast cancer.  BRCA-related cancer screening. This may be done if you have a family history of breast, ovarian, tubal, or peritoneal cancers.  Pelvic exam and Pap test. ? This may be done every 3 years starting at age 106. ? Starting at age 29, this may be done every 5 years if you have a Pap test in combination with an HPV test. Other tests  STD (sexually transmitted disease) testing, if you are at risk.  Bone density scan. This is done to screen for osteoporosis. You may have this scan if you are at high risk for osteoporosis. Talk with your health care provider about your test results, treatment options, and if necessary, the need for more tests. Follow these instructions at home: Eating and drinking  Eat a diet that includes fresh fruits and vegetables, whole grains, lean protein, and low-fat dairy products.  Take vitamin and mineral supplements  as recommended by your health care provider.  Do not drink alcohol if: ? Your health care provider tells you not to drink. ? You are pregnant, may be pregnant, or are planning to become pregnant.  If you drink alcohol: ? Limit how much you have to 0-1 drink a day. ? Be aware of how much alcohol is in your drink. In the [place] one drink equals one 12 oz bottle of beer (355 mL), one 5 oz glass of  wine (148 mL), or one 1 oz glass of hard liquor (44 mL).   Lifestyle  Take daily care of your teeth and gums. Brush your teeth every morning and night with fluoride toothpaste. Floss one time each day.  Stay active. Exercise for at least 30 minutes 5 or more days each week.  Do not use any products that contain nicotine or tobacco, such as cigarettes, e-cigarettes, and chewing tobacco. If you need help quitting, ask your health care provider.  Do not use drugs.  If you are sexually active, practice safe sex. Use a condom or other form of protection to prevent STIs (sexually transmitted infections).  If you do not wish to become pregnant, use a form of birth control. If you plan to become pregnant, see your health care provider for a prepregnancy visit.  If told by your health care provider, take low-dose aspirin daily starting at age 50.  Find healthy ways to cope with stress, such as: ? Meditation, yoga, or listening to music. ? Journaling. ? Talking to a trusted person. ? Spending time with friends and family. Safety  Always wear your seat belt while driving or riding in a vehicle.  Do not drive: ? If you have been drinking alcohol. Do not ride with someone who has been drinking. ? When you are tired or distracted. ? While texting.  Wear a helmet and other protective equipment during sports activities.  If you have firearms in your house, make sure you follow all gun safety procedures. What's next?  Visit your health care provider once a year for an annual wellness visit.  Ask your health care provider how often you should have your eyes and teeth checked.  Stay up to date on all vaccines. This information is not intended to replace advice given to you by your health care provider. Make sure you discuss any questions you have with your health care provider. Document Revised: 04/04/2020 Document Reviewed: 03/12/2018 Elsevier Patient Education  2021 Elsevier Inc.  

## 2020-10-13 NOTE — Telephone Encounter (Signed)
Patient was seen this morning and would like to know if you are going to prescribe her the weight loss drug that  Was. Discussed. Please call in to [facility] [place]

## 2020-10-16 NOTE — Telephone Encounter (Signed)
Patient notified

## 2020-10-30 ENCOUNTER — Encounter: Payer: Self-pay | Admitting: Family Medicine

## 2020-10-30 ENCOUNTER — Other Ambulatory Visit: Payer: Self-pay

## 2020-10-30 DIAGNOSIS — I10 Essential (primary) hypertension: Secondary | ICD-10-CM

## 2020-10-30 MED ORDER — TRIAMTERENE-HCTZ 37.5-25 MG PO TABS: 1.0000 | ORAL_TABLET | Freq: Every day | ORAL | 1 refills | Status: DC

## 2020-11-07 ENCOUNTER — Telehealth: Payer: Self-pay | Admitting: Emergency Medicine

## 2020-11-07 NOTE — Telephone Encounter (Signed)
Patient called and stated that insurance will not cover Forteo. They stated she will have to try Boniva or Reclast first.

## 2020-11-08 ENCOUNTER — Other Ambulatory Visit: Payer: Self-pay | Admitting: Family Medicine

## 2020-11-08 MED ORDER — IBANDRONATE SODIUM 150 MG PO TABS: 150.0000 mg | ORAL_TABLET | ORAL | 3 refills | Status: DC

## 2020-11-10 ENCOUNTER — Other Ambulatory Visit: Payer: Self-pay

## 2020-11-22 ENCOUNTER — Other Ambulatory Visit: Payer: Self-pay

## 2020-11-28 ENCOUNTER — Telehealth: Payer: Self-pay | Admitting: Family Medicine

## 2020-11-28 NOTE — Telephone Encounter (Signed)
[place], [place] requesting PA for Teriparatide, Recombinant, (FORTEO) 620 MCG/2.48ML SOPN  PA: [phone]

## 2020-11-29 ENCOUNTER — Telehealth: Payer: Self-pay

## 2020-11-29 NOTE — Telephone Encounter (Signed)
Returned call to number provided, PA not approved. Patient aware.

## 2020-11-30 NOTE — Telephone Encounter (Signed)
Pharmacy calling to request an update on the PA. Please call back [phone]

## 2020-12-04 NOTE — Telephone Encounter (Signed)
Yes ma'am, it goes through the [employer] [facility] [employer] and they will fill it directly and mail to the patient.

## 2020-12-27 ENCOUNTER — Other Ambulatory Visit: Payer: Self-pay | Admitting: Family Medicine

## 2020-12-27 DIAGNOSIS — M25562 Pain in left knee: Secondary | ICD-10-CM

## 2020-12-27 DIAGNOSIS — M7542 Impingement syndrome of left shoulder: Secondary | ICD-10-CM

## 2020-12-27 NOTE — Telephone Encounter (Signed)
Requested Prescriptions  Pending Prescriptions Disp Refills  . meloxicam (MOBIC) 15 MG tablet [Pharmacy Med Name: MELOXICAM 15 MG TABLET] 30 tablet 2    Sig: TAKE 1 TABLET (15 MG TOTAL) BY MOUTH DAILY.     Analgesics:  COX2 Inhibitors Passed - 12/27/2020  1:16 AM      Passed - HGB in normal range and within 360 days    Hemoglobin  Date Value Ref Range Status  06/19/2020 14.2 11.7 - 15.5 g/dL Final         Passed - Cr in normal range and within 360 days    Creat  Date Value Ref Range Status  06/19/2020 0.95 0.50 - 1.05 mg/dL Final    Comment:    For patients >73 years of age, the reference limit for Creatinine is approximately 13% higher for people identified as African-American. Renella Cunas - Patient is not pregnant      Passed - Valid encounter within last 12 months    Recent Outpatient Visits          2 months ago Well adult exam   [facility] Steele Sizer, MD   3 months ago Impingement syndrome of shoulder, left   [facility] Steele Sizer, MD   6 months ago Essential hypertension   [facility] Steele Sizer, MD   6 months ago Vertigo   [facility] Delsa Grana, PA-C   1 year ago Productive cough   [facility] Kathrine Haddock, NP      Future Appointments            In 3 months Steele Sizer, MD [facility], [facility]

## 2020-12-29 ENCOUNTER — Ambulatory Visit: Payer: BLUE CROSS/BLUE SHIELD | Admitting: Family Medicine

## 2021-01-12 ENCOUNTER — Telehealth: Payer: Managed Care, Other (non HMO) | Admitting: Physician Assistant

## 2021-01-12 ENCOUNTER — Encounter: Payer: Self-pay | Admitting: Physician Assistant

## 2021-01-12 DIAGNOSIS — H1032 Unspecified acute conjunctivitis, left eye: Secondary | ICD-10-CM

## 2021-01-12 MED ORDER — POLYMYXIN B-TRIMETHOPRIM 10000-0.1 UNIT/ML-% OP SOLN: 1.0000 [drp] | OPHTHALMIC | 0 refills | Status: DC

## 2021-01-12 NOTE — Progress Notes (Signed)
[name], urieta are scheduled for a virtual visit with your provider today.    Just as we do with appointments in the office, we must obtain your consent to participate.  Your consent will be active for this visit and any virtual visit you may have with one of our providers in the next 365 days.    If you have a MyChart account, I can also send a copy of this consent to you electronically.  All virtual visits are billed to your insurance company just like a traditional visit in the office.  As this is a virtual visit, video technology does not allow for your provider to perform a traditional examination.  This may limit your provider's ability to fully assess your condition.  If your provider identifies any concerns that need to be evaluated in person or the need to arrange testing such as labs, EKG, etc, we will make arrangements to do so.    Although advances in technology are sophisticated, we cannot ensure that it will always work on either your end or our end.  If the connection with a video visit is poor, we may have to switch to a telephone visit.  With either a video or telephone visit, we are not always able to ensure that we have a secure connection.   I need to obtain your verbal consent now.   Are you willing to proceed with your visit today?   Amy May has provided verbal consent on 01/12/2021 for a virtual visit (video or telephone).   Mar Daring, PA-C 01/12/2021  8:47 AM  Virtual Visit Consent   Amy May, you are scheduled for a virtual visit with a [facility] provider today.     Just as with appointments in the office, your consent must be obtained to participate.  Your consent will be active for this visit and any virtual visit you may have with one of our providers in the next 365 days.     If you have a MyChart account, a copy of this consent can be sent to you electronically.  All virtual visits are billed to your insurance company just like a traditional  visit in the office.    As this is a virtual visit, video technology does not allow for your provider to perform a traditional examination.  This may limit your provider's ability to fully assess your condition.  If your provider identifies any concerns that need to be evaluated in person or the need to arrange testing (such as labs, EKG, etc.), we will make arrangements to do so.     Although advances in technology are sophisticated, we cannot ensure that it will always work on either your end or our end.  If the connection with a video visit is poor, the visit may have to be switched to a telephone visit.  With either a video or telephone visit, we are not always able to ensure that we have a secure connection.     I need to obtain your verbal consent now.   Are you willing to proceed with your visit today?    Amy May has provided verbal consent on 01/12/2021 for a virtual visit (video or telephone).   Mar Daring, PA-C   Date: 01/12/2021 8:47 AM   Virtual Visit via Video Note   I, Mar Daring, connected with  Amy May  ([id number], [birth year]) on 01/12/21 at  8:00 AM EDT by a video-enabled telemedicine application  and verified that I am speaking with the correct person using two identifiers.  Location: Patient: Virtual Visit Location Patient: Home Provider: Virtual Visit Location Provider: Home Office   I discussed the limitations of evaluation and management by telemedicine and the availability of in person appointments. The patient expressed understanding and agreed to proceed.    History of Present Illness: Amy May is a 55 y.o. who identifies as a female who was assigned female at birth, and is being seen today for redness and drainage from left eye.  HPI: Conjunctivitis  The current episode started yesterday. The onset was gradual. The problem occurs frequently. The problem has been gradually worsening. The problem is mild. Nothing relieves the  symptoms. The symptoms are aggravated by light. Associated symptoms include eye itching, eye discharge and eye redness (left). Pertinent negatives include no fever, no decreased vision, no double vision, no rhinorrhea, no sore throat, no URI and no eye pain. The left eye is affected. The eyelid exhibits swelling and redness.     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/21/2020   Plantar fasciitis, bilateral 06/19/2020   Vitamin D deficiency 06/19/2020   B12 deficiency 06/19/2020   Paresthesia of skin 06/19/2020   Benign paroxysmal positional vertigo 06/19/2020   Bunion, right 06/19/2020   Acquired trigger finger of right middle finger 01/26/2020   Low vitamin D level 07/14/2018   Abnormal TSH 07/14/2018   Paroxysmal tachycardia (HCC) 07/06/2018   Abnormal EKG 07/05/2018   Palpitations 07/05/2018   Chronic right shoulder pain 04/30/2018   Body aches 04/30/2018   Essential hypertension 07/12/2015   Class 1 obesity due to excess calories with serious comorbidity and body mass index (BMI) of 32.0 to 32.9 in adult 07/12/2015   Hyperlipidemia 07/12/2015   Female genuine stress incontinence 11/10/2013   Acquired cyst of kidney 11/10/2013    Allergies: No Known Allergies Medications:  Current Outpatient Medications:    trimethoprim-polymyxin b (POLYTRIM) ophthalmic solution, Place 1 drop into the left eye every 4 (four) hours. X 3-5 days, Disp: 10 mL, Rfl: 0   fluticasone (FLONASE) 50 MCG/ACT nasal spray, Place 2 sprays into both nostrils daily. (Patient not taking: No sig reported), Disp: 48 g, Rfl: 1   ibandronate (BONIVA) 150 MG tablet, Take 1 tablet (150 mg total) by mouth every 30 (thirty) days. Take in the morning with a full glass of water, on an empty stomach, and do not take anything else by mouth or lie down for the next 30 min., Disp: 12 tablet, Rfl: 3   levocetirizine (XYZAL) 5 MG tablet, Take 1 tablet (5 mg total) by mouth  daily., Disp: 90 tablet, Rfl: 1   meclizine (ANTIVERT) 25 MG tablet, Take 0.5-1 tablets (12.5-25 mg total) by mouth 3 (three) times daily as needed for dizziness. (Patient not taking: No sig reported), Disp: 20 tablet, Rfl: 1   meloxicam (MOBIC) 15 MG tablet, TAKE 1 TABLET (15 MG TOTAL) BY MOUTH DAILY., Disp: 30 tablet, Rfl: 2   triamterene-hydrochlorothiazide (MAXZIDE-25) 37.5-25 MG tablet, Take 1 tablet by mouth daily., Disp: 90 tablet, Rfl: 1   Vitamin D, Ergocalciferol, (DRISDOL) 1.25 MG (50000 UNIT) CAPS capsule, Take 1 capsule (50,000 Units total) by mouth every 7 (seven) days., Disp: 12 capsule, Rfl: 1  Observations/Objective: Patient is well-developed, well-nourished in no acute distress.  Resting comfortably at home.  Head is normocephalic, atraumatic.  No labored breathing. Speech is clear and coherent with  logical content.  Patient is alert and oriented at baseline.  Left conjunctiva is red and swollen, clear discharge appreciated  Assessment and Plan: 1. Acute bacterial conjunctivitis of left eye - trimethoprim-polymyxin b (POLYTRIM) ophthalmic solution; Place 1 drop into the left eye every 4 (four) hours. X 3-5 days  Dispense: 10 mL; Refill: 0 - Polytrim prescribed as above - Practice good hand hygiene - Seek in person evaluation if not improving or worsening  Follow Up Instructions: I discussed the assessment and treatment plan with the patient. The patient was provided an opportunity to ask questions and all were answered. The patient agreed with the plan and demonstrated an understanding of the instructions.  A copy of instructions were sent to the patient via MyChart.  The patient was advised to call back or seek an in-person evaluation if the symptoms worsen or if the condition fails to improve as anticipated.  Time:  I spent 8 minutes with the patient via telehealth technology discussing the above problems/concerns.    Mar Daring, PA-C'

## 2021-01-12 NOTE — Patient Instructions (Signed)
Amy May, thank you for joining Mar Daring, PA-C for today's virtual visit.  While this provider is not your primary care provider (PCP), if your PCP is located in our provider database this encounter information will be shared with them immediately following your visit.  Consent: (Patient) Amy May provided verbal consent for this virtual visit at the beginning of the encounter.  Current Medications:  Current Outpatient Medications:    trimethoprim-polymyxin b (POLYTRIM) ophthalmic solution, Place 1 drop into the left eye every 4 (four) hours. X 3-5 days, Disp: 10 mL, Rfl: 0   fluticasone (FLONASE) 50 MCG/ACT nasal spray, Place 2 sprays into both nostrils daily. (Patient not taking: No sig reported), Disp: 48 g, Rfl: 1   ibandronate (BONIVA) 150 MG tablet, Take 1 tablet (150 mg total) by mouth every 30 (thirty) days. Take in the morning with a full glass of water, on an empty stomach, and do not take anything else by mouth or lie down for the next 30 min., Disp: 12 tablet, Rfl: 3   levocetirizine (XYZAL) 5 MG tablet, Take 1 tablet (5 mg total) by mouth daily., Disp: 90 tablet, Rfl: 1   meclizine (ANTIVERT) 25 MG tablet, Take 0.5-1 tablets (12.5-25 mg total) by mouth 3 (three) times daily as needed for dizziness. (Patient not taking: No sig reported), Disp: 20 tablet, Rfl: 1   meloxicam (MOBIC) 15 MG tablet, TAKE 1 TABLET (15 MG TOTAL) BY MOUTH DAILY., Disp: 30 tablet, Rfl: 2   triamterene-hydrochlorothiazide (MAXZIDE-25) 37.5-25 MG tablet, Take 1 tablet by mouth daily., Disp: 90 tablet, Rfl: 1   Vitamin D, Ergocalciferol, (DRISDOL) 1.25 MG (50000 UNIT) CAPS capsule, Take 1 capsule (50,000 Units total) by mouth every 7 (seven) days., Disp: 12 capsule, Rfl: 1   Medications ordered in this encounter:  Meds ordered this encounter  Medications   trimethoprim-polymyxin b (POLYTRIM) ophthalmic solution    Sig: Place 1 drop into the left eye every 4 (four) hours. X 3-5 days     Dispense:  10 mL    Refill:  0    Order Specific Question:   Supervising Provider    Answer:   Sabra Heck, BRIAN [[id number]]     *If you need refills on other medications prior to your next appointment, please contact your pharmacy*  Follow-Up: Call back or seek an in-person evaluation if the symptoms worsen or if the condition fails to improve as anticipated.  If you have been instructed to have an in-person evaluation today at a local Urgent Care facility, please use the link below. It will take you to a list of all of our available [facility], including address, phone number and hours of operation. Please do not delay care.  [facility]  If you or a family member do not have a primary care provider, use the link below to schedule a visit and establish care. When you choose a [facility] primary care physician or advanced practice provider, you gain a long-term partner in health. Find a Primary Care Provider  Learn more about [facility]'s in-office and virtual care options: [facility]  Bacterial Conjunctivitis, Adult Bacterial conjunctivitis is an infection of the clear membrane that covers the white part of your eye and the inner surface of your eyelid (conjunctiva). When the blood vessels in your conjunctiva become inflamed, your eye becomes red or pink, and it will probably feel itchy. Bacterial conjunctivitis spreads very easily from person to person (  is contagious). It also spreads easily from one eye to the other eye. What are the causes? This condition is caused by bacteria. You may get the infection if you come into close contact with: A person who is infected with the bacteria. Items that are contaminated with the bacteria, such as a face towel, contact lens solution, or eye makeup. What increases the risk? You are more likely to develop this condition if you: Are exposed to other people who have the infection. Wear contact lenses. Have  a sinus infection. Have had a recent eye injury or surgery. Have a weak body defense system (immune system). Have a medical condition that causes dry eyes. What are the signs or symptoms? Symptoms of this condition include: Thick, yellowish discharge from the eye. This may turn into a crust on the eyelid overnight and cause your eyelids to stick together. Tearing or watery eyes. Itchy eyes. Burning feeling in your eyes. Eye redness. Swollen eyelids. Blurred vision. How is this diagnosed? This condition is diagnosed based on your symptoms and medical history. Your health care provider may also take a sample of discharge from your eye to findthe cause of your infection. This is rarely done. How is this treated? This condition may be treated with: Antibiotic eye drops or ointment to clear the infection more quickly and prevent the spread of infection to others. Oral antibiotic medicines to treat infections that do not respond to drops or ointments or that last longer than 10 days. Cool, wet cloths (cool compresses) placed on the eyes. Artificial tears applied 2-6 times a day. Follow these instructions at home: Medicines Take or apply your antibiotic medicine as told by your health care provider. Do not stop taking or applying the antibiotic even if you start to feel better. Take or apply over-the-counter and prescription medicines only as told by your health care provider. Be very careful to avoid touching the edge of your eyelid with the eye-drop bottle or the ointment tube when you apply medicines to the affected eye. This will keep you from spreading the infection to your other eye or to other people. Managing discomfort Gently wipe away any drainage from your eye with a warm, wet washcloth or a cotton ball. Apply a clean, cool compress to your eye for 10-20 minutes, 3-4 times a day. General instructions Do not wear contact lenses until the inflammation is gone and your health care  provider says it is safe to wear them again. Ask your health care provider how to sterilize or replace your contact lenses before you use them again. Wear glasses until you can resume wearing contact lenses. Avoid wearing eye makeup until the inflammation is gone. Throw away any old eye cosmetics that may be contaminated. Change or wash your pillowcase every day. Do not share towels or washcloths. This may spread the infection. Wash your hands often with soap and water. Use paper towels to dry your hands. Avoid touching or rubbing your eyes. Do not drive or use heavy machinery if your vision is blurred. Contact a health care provider if: You have a fever. Your symptoms do not get better after 10 days. Get help right away if you have: A fever and your symptoms suddenly get worse. Severe pain when you move your eye. Facial pain, redness, or swelling. Sudden loss of vision. Summary Bacterial conjunctivitis is an infection of the clear membrane that covers the white part of your eye and the inner surface of your eyelid (conjunctiva). Bacterial conjunctivitis spreads  very easily from person to person (is contagious). Wash your hands often with soap and water. Use paper towels to dry your hands. Take or apply your antibiotic medicine as told by your health care provider. Do not stop taking or applying the antibiotic even if you start to feel better. Contact a health care provider if you have a fever or your symptoms do not get better after 10 days. This information is not intended to replace advice given to you by your health care provider. Make sure you discuss any questions you have with your healthcare provider. Document Revised: 10/20/2018 Document Reviewed: 02/04/2018 Elsevier Patient Education  [place].

## 2021-02-20 ENCOUNTER — Other Ambulatory Visit: Payer: Self-pay

## 2021-02-20 ENCOUNTER — Telehealth (INDEPENDENT_AMBULATORY_CARE_PROVIDER_SITE_OTHER): Payer: Managed Care, Other (non HMO) | Admitting: Family Medicine

## 2021-02-20 ENCOUNTER — Encounter: Payer: Self-pay | Admitting: Family Medicine

## 2021-02-21 NOTE — Progress Notes (Signed)
Patient canceled prior to being seen.

## 2021-03-24 ENCOUNTER — Other Ambulatory Visit: Payer: Self-pay | Admitting: Family Medicine

## 2021-03-24 DIAGNOSIS — M816 Localized osteoporosis [Lequesne]: Secondary | ICD-10-CM

## 2021-03-24 DIAGNOSIS — E559 Vitamin D deficiency, unspecified: Secondary | ICD-10-CM

## 2021-03-24 NOTE — Telephone Encounter (Signed)
Requested medication (s) are due for refill today: yes  Requested medication (s) are on the active medication list: yes  Last refill:  10/13/20  Future visit scheduled: yes  Notes to clinic:  med not delegated to NT to RF   Requested Prescriptions  Pending Prescriptions Disp Refills   Vitamin D, Ergocalciferol, (DRISDOL) 1.25 MG (50000 UNIT) CAPS capsule [Pharmacy Med Name: VITAMIN D2 1.'25MG'$ (50,000 UNIT)] 4 capsule 5    Sig: Take 1 capsule (50,000 Units total) by mouth every 7 (seven) days.     Endocrinology:  Vitamins - Vitamin D Supplementation Failed - 03/24/2021 10:08 AM      Failed - 50,000 IU strengths are not delegated      Failed - Phosphate in normal range and within 360 days    No results found for: PHOS        Failed - Vitamin D in normal range and within 360 days    Vit D, 25-Hydroxy  Date Value Ref Range Status  06/19/2020 28 (L) 30 - 100 ng/mL Final    Comment:    Vitamin D Status         25-OH Vitamin D: . Deficiency:                    <20 ng/mL Insufficiency:             20 - 29 ng/mL Optimal:                 > or = 30 ng/mL . For 25-OH Vitamin D testing on patients on  D2-supplementation and patients for whom quantitation  of D2 and D3 fractions is required, the [employer](TM) 25-OH VIT D, (D2,D3), LC/MS/MS is recommended: order  code [id number] (patients >67yr). See Note 1 . Note 1 . For additional information, please refer to  http://education.Quest[facility].com/faq/FAQ199  (This link is being provided for informational/ educational purposes only.)           Passed - Ca in normal range and within 360 days    Calcium  Date Value Ref Range Status  06/19/2020 10.2 8.6 - 10.4 mg/dL Final          Passed - Valid encounter within last 12 months    Recent Outpatient Visits           1 month ago [facility]one[facility]   5 months ago Well adult [facility]   [facility]   6 months ago Impingement syndrome of shoulder, [facility]   [facility]   9 months [facility]ential hypertension   [facility]   [facility]      Future Appointments             In 3 weeks [facility]

## 2021-03-26 ENCOUNTER — Other Ambulatory Visit: Payer: Self-pay | Admitting: Family Medicine

## 2021-03-26 DIAGNOSIS — G8929 Other chronic pain: Secondary | ICD-10-CM

## 2021-03-26 DIAGNOSIS — M25562 Pain in left knee: Secondary | ICD-10-CM

## 2021-03-26 DIAGNOSIS — M7542 Impingement syndrome of left shoulder: Secondary | ICD-10-CM

## 2021-03-26 NOTE — Telephone Encounter (Signed)
Last seen 8.9.2022 upcoming [id number]

## 2021-04-12 NOTE — Progress Notes (Signed)
Name: Amy May   MRN: [id number]    DOB: 1966-07-03   Date:04/13/2021       Progress Note  Subjective  Chief Complaint  Follow Up  HPI  HTN: she takes medication daily, bp is at goal, no chest pain, palpitation or sob. She states feeling better since walking on a regular basis   Abnormal WVP:XTGG level normalized, reassurance given   Morbid obesity: BMI is above 35 with co-morbidities, HTN, hyperlipidemia, prediabetes , she is doing much better, lost 9 lbs since Dec 2021, she is avoiding sodas, walking 4 days a week for almost one hour. Discussed possible side effects. She denies family history of thyroid cancer, no personal history of pancreatitis.   Vitamin D and B12 deficiency: we will recheck labs . Taking supplements   Pre-diabetes: last A1C was 6%, she has been walking 3 miles 4 days a week. She lost 9 lbs since last Dec. She denies polyphagia, polydipsia or polyuria She would like to try medications for weight loss, she asked about GLP-1 agonist   Hyperlipidemia: not on medication, reviewed levels.   The 10-year ASCVD risk score (Arnett DK, et al., 2019) is: 5.5%   Values used to calculate the score:     Age: 55 years     Sex: Female     Is Non-Hispanic African American: Yes     Diabetic: No     Tobacco smoker: No     Systolic Blood Pressure: 269 mmHg     Is BP treated: Yes     HDL Cholesterol: 48 mg/dL     Total Cholesterol: 173 mg/dL   Plantar fascitis: wearing tennis shoes now, doing much better, still taking Meloxicam, doing much better, mild soreness at the end of the day   Impingement shoulder syndrome and Knee pain: seen by Ortho, taking Meloxicam and is doing better   Osteoporosis of spine: she is taking Boniva and also vitamin D otc. 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/21/2020   Plantar fasciitis, bilateral 06/19/2020   Vitamin D deficiency 06/19/2020   B12 deficiency  06/19/2020   Paresthesia of skin 06/19/2020   Benign paroxysmal positional vertigo 06/19/2020   Bunion, right 06/19/2020   Acquired trigger finger of right middle finger 01/26/2020   Low vitamin D level 07/14/2018   Abnormal TSH 07/14/2018   Paroxysmal tachycardia (HCC) 07/06/2018   Abnormal EKG 07/05/2018   Palpitations 07/05/2018   Chronic right shoulder pain 04/30/2018   Body aches 04/30/2018   Essential hypertension 07/12/2015   Class 1 obesity due to excess calories with serious comorbidity and body mass index (BMI) of 32.0 to 32.9 in adult 07/12/2015   Hyperlipidemia 07/12/2015   Female genuine stress incontinence 11/10/2013   Acquired cyst of kidney 11/10/2013    Past Surgical History:  Procedure Laterality Date   COLONOSCOPY WITH PROPOFOL N/A 05/25/2018   Procedure: COLONOSCOPY WITH PROPOFOL;  Surgeon: Lin Landsman, MD;  Location: [facility] ENDOSCOPY;  Service: Gastroenterology;  Laterality: N/A;   DILATION AND CURETTAGE OF UTERUS     TUBAL LIGATION      Family History  Problem Relation Age of Onset   Heart disease Maternal Aunt    Breast cancer Neg Hx     Social History   Tobacco Use   Smoking status: Never   Smokeless tobacco: Never  Substance Use Topics   Alcohol use: No  Alcohol/week: 0.0 standard drinks     Current Outpatient Medications:    fluticasone (FLONASE) 50 MCG/ACT nasal spray, Place 2 sprays into both nostrils daily., Disp: 48 g, Rfl: 1   ibandronate (BONIVA) 150 MG tablet, Take 1 tablet (150 mg total) by mouth every 30 (thirty) days. Take in the morning with a full glass of water, on an empty stomach, and do not take anything else by mouth or lie down for the next 30 min., Disp: 12 tablet, Rfl: 3   levocetirizine (XYZAL) 5 MG tablet, Take 1 tablet (5 mg total) by mouth daily., Disp: 90 tablet, Rfl: 1   meclizine (ANTIVERT) 25 MG tablet, Take 0.5-1 tablets (12.5-25 mg total) by mouth 3 (three) times daily as needed for dizziness., Disp: 20  tablet, Rfl: 1   meloxicam (MOBIC) 15 MG tablet, TAKE 1 TABLET (15 MG TOTAL) BY MOUTH DAILY., Disp: 30 tablet, Rfl: 0   triamterene-hydrochlorothiazide (MAXZIDE-25) 37.5-25 MG tablet, Take 1 tablet by mouth daily., Disp: 90 tablet, Rfl: 1   trimethoprim-polymyxin b (POLYTRIM) ophthalmic solution, Place 1 drop into the left eye every 4 (four) hours. X 3-5 days, Disp: 10 mL, Rfl: 0   Vitamin D, Ergocalciferol, (DRISDOL) 1.25 MG (50000 UNIT) CAPS capsule, TAKE 1 CAPSULE (50,000 UNITS TOTAL) BY MOUTH EVERY 7 (SEVEN) DAYS, Disp: 4 capsule, Rfl: 5  No Known Allergies  I personally reviewed active problem list, medication list, allergies, family history, social history, health maintenance with the patient/caregiver today.   ROS  Constitutional: Negative for fever or weight change.  Respiratory: Negative for cough and shortness of breath.   Cardiovascular: Negative for chest pain or palpitations.  Gastrointestinal: Negative for abdominal pain, no bowel changes.  Musculoskeletal: Negative for gait problem or joint swelling.  Skin: Negative for rash.  Neurological: Negative for dizziness or headache.  No other specific complaints in a complete review of systems (except as listed in HPI above).   Objective  Vitals:   04/13/21 0738  BP: 130/80  Pulse: 81  Resp: 16  Temp: 98 F (36.7 C)  SpO2: 98%  Weight: 193 lb (87.5 kg)  Height: 5\' 2"  (1.575 m)    Body mass index is 35.3 kg/m.  Physical Exam  Constitutional: Patient appears well-developed and well-nourished. Obese  No distress.  HEENT: head atraumatic, normocephalic, pupils equal and reactive to light,  neck supple Cardiovascular: Normal rate, regular rhythm and normal heart sounds.  No murmur heard. No BLE edema. Pulmonary/Chest: Effort normal and breath sounds normal. No respiratory distress. Abdominal: Soft.  There is no tenderness. Psychiatric: Patient has a normal mood and affect. behavior is normal. Judgment and thought  content normal.   PHQ2/9: Depression screen [facility] 2/9 04/13/2021 02/20/2021 10/13/2020 09/14/2020 06/19/2020  Decreased Interest 0 0 0 0 0  Down, Depressed, Hopeless 0 0 0 0 0  PHQ - 2 Score 0 0 0 0 0  Altered sleeping - - - - -  Tired, decreased energy - - - - -  Change in appetite - - - - -  Feeling bad or failure about yourself  - - - - -  Trouble concentrating - - - - -  Moving slowly or fidgety/restless - - - - -  Suicidal thoughts - - - - -  PHQ-9 Score - - - - -  Difficult doing work/chores - - - - -  Some recent data might be hidden    phq 9 is negative   Fall Risk: Fall Risk  04/13/2021 02/20/2021  10/13/2020 09/14/2020 06/19/2020  Falls in the past year? 0 0 0 0 0  Number falls in past yr: 0 0 0 0 0  Injury with Fall? 0 0 0 0 0  Risk for fall due to : No Fall Risks - - - -  Follow up Falls prevention discussed Falls evaluation completed - Falls evaluation completed -      Functional Status Survey: Is the patient deaf or have difficulty hearing?: No Does the patient have difficulty seeing, even when wearing glasses/contacts?: No Does the patient have difficulty concentrating, remembering, or making decisions?: No Does the patient have difficulty walking or climbing stairs?: No Does the patient have difficulty dressing or bathing?: No Does the patient have difficulty doing errands alone such as visiting a doctor's office or shopping?: No    Assessment & Plan  1. Essential hypertension  - CBC with Differential/Platelet - COMPLETE METABOLIC PANEL WITH GFR - triamterene-hydrochlorothiazide (MAXZIDE-25) 37.5-25 MG tablet; Take 1 tablet by mouth daily.  Dispense: 90 tablet; Refill: 1  2. Mixed hyperlipidemia  - Lipid panel  3. Vitamin D deficiency   4. B12 deficiency  - CBC with Differential/Platelet  5. Abnormal thyroid blood test   6. Chronic pain of both knees  - meloxicam (MOBIC) 15 MG tablet; Take 1 tablet (15 mg total) by mouth daily.  Dispense: 90 tablet;  Refill: 1  7. Plantar fasciitis, bilateral   8. Pre-diabetes  - Hemoglobin A1c  9. Impingement syndrome of shoulder, left  - meloxicam (MOBIC) 15 MG tablet; Take 1 tablet (15 mg total) by mouth daily.  Dispense: 90 tablet; Refill: 1  10. Seasonal allergic rhinitis, unspecified trigger  - levocetirizine (XYZAL) 5 MG tablet; Take 1 tablet (5 mg total) by mouth daily.  Dispense: 90 tablet; Refill: 1  11. Paroxysmal tachycardia ([place])  Doing well, no longer having problems    12. Morbid obesity ([place])  - Semaglutide,0.25 or 0.5MG /DOS, (OZEMPIC, 0.25 OR 0.5 MG/DOSE,) 2 MG/1.5ML SOPN; Inject 0.5 mg into the skin once a week.  Dispense: 4.5 mL; Refill: 1

## 2021-04-13 ENCOUNTER — Other Ambulatory Visit: Payer: Self-pay

## 2021-04-13 ENCOUNTER — Encounter: Payer: Self-pay | Admitting: Family Medicine

## 2021-04-13 ENCOUNTER — Ambulatory Visit (INDEPENDENT_AMBULATORY_CARE_PROVIDER_SITE_OTHER): Payer: Managed Care, Other (non HMO) | Admitting: Family Medicine

## 2021-04-13 VITALS — BP 130/80 | HR 81 | Temp 98.0°F | Resp 16 | Ht 62.0 in | Wt 193.0 lb

## 2021-04-13 DIAGNOSIS — M7542 Impingement syndrome of left shoulder: Secondary | ICD-10-CM

## 2021-04-13 DIAGNOSIS — J302 Other seasonal allergic rhinitis: Secondary | ICD-10-CM

## 2021-04-13 DIAGNOSIS — E559 Vitamin D deficiency, unspecified: Secondary | ICD-10-CM

## 2021-04-13 DIAGNOSIS — E538 Deficiency of other specified B group vitamins: Secondary | ICD-10-CM | POA: Diagnosis not present

## 2021-04-13 DIAGNOSIS — E782 Mixed hyperlipidemia: Secondary | ICD-10-CM

## 2021-04-13 DIAGNOSIS — M722 Plantar fascial fibromatosis: Secondary | ICD-10-CM

## 2021-04-13 DIAGNOSIS — I10 Essential (primary) hypertension: Secondary | ICD-10-CM

## 2021-04-13 DIAGNOSIS — G8929 Other chronic pain: Secondary | ICD-10-CM

## 2021-04-13 DIAGNOSIS — I479 Paroxysmal tachycardia, unspecified: Secondary | ICD-10-CM

## 2021-04-13 DIAGNOSIS — R7303 Prediabetes: Secondary | ICD-10-CM

## 2021-04-13 DIAGNOSIS — M25561 Pain in right knee: Secondary | ICD-10-CM

## 2021-04-13 DIAGNOSIS — R7989 Other specified abnormal findings of blood chemistry: Secondary | ICD-10-CM

## 2021-04-13 DIAGNOSIS — M25562 Pain in left knee: Secondary | ICD-10-CM

## 2021-04-13 MED ORDER — OZEMPIC (0.25 OR 0.5 MG/DOSE) 2 MG/1.5ML ~~LOC~~ SOPN: 0.5000 mg | PEN_INJECTOR | SUBCUTANEOUS | 1 refills | Status: DC

## 2021-04-13 MED ORDER — LEVOCETIRIZINE DIHYDROCHLORIDE 5 MG PO TABS: 5.0000 mg | ORAL_TABLET | Freq: Every day | ORAL | 1 refills | Status: DC

## 2021-04-13 MED ORDER — MELOXICAM 15 MG PO TABS: 15.0000 mg | ORAL_TABLET | Freq: Every day | ORAL | 1 refills | Status: DC

## 2021-04-13 MED ORDER — TRIAMTERENE-HCTZ 37.5-25 MG PO TABS
1.0000 | ORAL_TABLET | Freq: Every day | ORAL | 1 refills | Status: DC
Start: 2021-04-13 — End: 2021-11-16

## 2021-04-14 LAB — COMPLETE METABOLIC PANEL WITH GFR
AG Ratio: 1.3 (calc) (ref 1.0–2.5)
ALT: 23 U/L (ref 6–29)
AST: 16 U/L (ref 10–35)
Albumin: 4.3 g/dL (ref 3.6–5.1)
Alkaline phosphatase (APISO): 61 U/L (ref 37–153)
BUN: 11 mg/dL (ref 7–25)
CO2: 25 mmol/L (ref 20–32)
Calcium: 9.2 mg/dL (ref 8.6–10.4)
Chloride: 104 mmol/L (ref 98–110)
Creat: 0.95 mg/dL (ref 0.50–1.03)
Globulin: 3.2 g/dL (calc) (ref 1.9–3.7)
Glucose, Bld: 82 mg/dL (ref 65–99)
Potassium: 4.5 mmol/L (ref 3.5–5.3)
Sodium: 138 mmol/L (ref 135–146)
Total Bilirubin: 0.4 mg/dL (ref 0.2–1.2)
Total Protein: 7.5 g/dL (ref 6.1–8.1)
eGFR: 70 mL/min/{1.73_m2} (ref >=60)

## 2021-04-14 LAB — CBC WITH DIFFERENTIAL/PLATELET
Absolute Monocytes: 298 cells/uL (ref 200–950)
Basophils Absolute: 58 cells/uL (ref 0–200)
Basophils Relative: 1.2 %
Eosinophils Absolute: 110 cells/uL (ref 15–500)
Eosinophils Relative: 2.3 %
HCT: 42.4 % (ref 35.0–45.0)
Hemoglobin: 14.2 g/dL (ref 11.7–15.5)
Lymphs Abs: 2160 cells/uL (ref 850–3900)
MCH: 28.6 pg (ref 27.0–33.0)
MCHC: 33.5 g/dL (ref 32.0–36.0)
MCV: 85.3 fL (ref 80.0–100.0)
MPV: 11 fL (ref 7.5–12.5)
Monocytes Relative: 6.2 %
Neutro Abs: 2174 cells/uL (ref 1500–7800)
Neutrophils Relative %: 45.3 %
Platelets: 285 10*3/uL (ref 140–400)
RBC: 4.97 10*6/uL (ref 3.80–5.10)
RDW: 13.2 % (ref 11.0–15.0)
Total Lymphocyte: 45 %
WBC: 4.8 10*3/uL (ref 3.8–10.8)

## 2021-04-14 LAB — LIPID PANEL
Cholesterol: 167 mg/dL (ref <200)
HDL: 49 mg/dL — ABNORMAL LOW (ref >=50)
LDL Cholesterol (Calc): 96 mg/dL (calc)
Non-HDL Cholesterol (Calc): 118 mg/dL (calc) (ref <130)
Total CHOL/HDL Ratio: 3.4 (calc) (ref <5.0)
Triglycerides: 127 mg/dL (ref <150)

## 2021-04-14 LAB — HEMOGLOBIN A1C
Hgb A1c MFr Bld: 5.2 % of total Hgb (ref <5.7)
Mean Plasma Glucose: 103 mg/dL
eAG (mmol/L): 5.7 mmol/L

## 2021-04-17 ENCOUNTER — Ambulatory Visit: Payer: Managed Care, Other (non HMO) | Admitting: Family Medicine

## 2021-05-11 ENCOUNTER — Telehealth: Payer: Self-pay | Admitting: Emergency Medicine

## 2021-05-11 ENCOUNTER — Other Ambulatory Visit: Payer: Self-pay | Admitting: Emergency Medicine

## 2021-05-11 ENCOUNTER — Other Ambulatory Visit: Payer: Self-pay

## 2021-05-11 DIAGNOSIS — U071 COVID-19: Secondary | ICD-10-CM

## 2021-05-11 DIAGNOSIS — R058 Other specified cough: Secondary | ICD-10-CM

## 2021-05-11 NOTE — Telephone Encounter (Signed)
Would like refill on albuterol inhaler. Patient stated this was discuss at last visit and she did not want at that time. Please send to [facility] or you may faxe  [phone] fax#. I see script under her history

## 2021-05-15 ENCOUNTER — Telehealth: Payer: Self-pay | Admitting: Emergency Medicine

## 2021-05-15 MED ORDER — OZEMPIC (0.25 OR 0.5 MG/DOSE) 2 MG/1.5ML ~~LOC~~ SOPN: 0.5000 mg | PEN_INJECTOR | SUBCUTANEOUS | 0 refills | Status: DC

## 2021-05-15 NOTE — Telephone Encounter (Signed)
Please send ozempic to [facility]. Can get there at her job for $10

## 2021-05-15 NOTE — Telephone Encounter (Signed)
Her last appt was 04/13/21 and she was asked to come back in 59mth. Pt is scheduled for early April on her day off.

## 2021-05-15 NOTE — Telephone Encounter (Signed)
Erroneous encounter

## 2021-05-16 ENCOUNTER — Other Ambulatory Visit: Payer: Self-pay

## 2021-05-16 MED ORDER — ALBUTEROL SULFATE HFA 108 (90 BASE) MCG/ACT IN AERS: 2.0000 | INHALATION_SPRAY | Freq: Four times a day (QID) | RESPIRATORY_TRACT | 1 refills | Status: DC | PRN

## 2021-05-16 MED ORDER — OZEMPIC (0.25 OR 0.5 MG/DOSE) 2 MG/1.5ML ~~LOC~~ SOPN: 0.5000 mg | PEN_INJECTOR | SUBCUTANEOUS | 0 refills | Status: DC

## 2021-05-16 NOTE — Telephone Encounter (Signed)
Please re send in says it printed

## 2021-05-17 ENCOUNTER — Other Ambulatory Visit: Payer: Self-pay | Admitting: Emergency Medicine

## 2021-05-17 MED ORDER — OZEMPIC (0.25 OR 0.5 MG/DOSE) 2 MG/1.5ML ~~LOC~~ SOPN: 0.5000 mg | PEN_INJECTOR | SUBCUTANEOUS | 0 refills | Status: DC

## 2021-05-17 MED ORDER — ALBUTEROL SULFATE HFA 108 (90 BASE) MCG/ACT IN AERS: 2.0000 | INHALATION_SPRAY | Freq: Four times a day (QID) | RESPIRATORY_TRACT | 1 refills | Status: DC | PRN

## 2021-08-09 ENCOUNTER — Other Ambulatory Visit: Payer: Self-pay | Admitting: Emergency Medicine

## 2021-08-09 MED ORDER — OZEMPIC (0.25 OR 0.5 MG/DOSE) 2 MG/1.5ML ~~LOC~~ SOPN: 0.5000 mg | PEN_INJECTOR | SUBCUTANEOUS | 0 refills | Status: DC

## 2021-08-16 ENCOUNTER — Other Ambulatory Visit: Payer: Self-pay

## 2021-08-16 MED ORDER — OZEMPIC (0.25 OR 0.5 MG/DOSE) 2 MG/1.5ML ~~LOC~~ SOPN: 0.5000 mg | PEN_INJECTOR | SUBCUTANEOUS | 0 refills | Status: DC

## 2021-08-30 ENCOUNTER — Other Ambulatory Visit: Payer: Self-pay | Admitting: Family Medicine

## 2021-08-30 ENCOUNTER — Telehealth: Payer: Self-pay | Admitting: Emergency Medicine

## 2021-08-30 ENCOUNTER — Other Ambulatory Visit: Payer: Self-pay

## 2021-08-30 DIAGNOSIS — Z1231 Encounter for screening mammogram for malignant neoplasm of breast: Secondary | ICD-10-CM

## 2021-08-30 DIAGNOSIS — M816 Localized osteoporosis [Lequesne]: Secondary | ICD-10-CM

## 2021-08-30 DIAGNOSIS — M810 Age-related osteoporosis without current pathological fracture: Secondary | ICD-10-CM

## 2021-08-30 DIAGNOSIS — Z13820 Encounter for screening for osteoporosis: Secondary | ICD-10-CM

## 2021-08-30 NOTE — Telephone Encounter (Signed)
Please put in orders for mammogram, bone density

## 2021-08-30 NOTE — Telephone Encounter (Signed)
Has appointment scheduled for 4/7 to follow up after 1 year with bone density and medication. Would like to know if she can get a order for Bone Density prior to that appointment. She was told to repeat in 1 year

## 2021-09-09 ENCOUNTER — Other Ambulatory Visit: Payer: Self-pay | Admitting: Family Medicine

## 2021-09-09 DIAGNOSIS — E559 Vitamin D deficiency, unspecified: Secondary | ICD-10-CM

## 2021-09-09 DIAGNOSIS — M816 Localized osteoporosis [Lequesne]: Secondary | ICD-10-CM

## 2021-09-10 ENCOUNTER — Other Ambulatory Visit: Payer: Self-pay | Admitting: Family Medicine

## 2021-09-10 DIAGNOSIS — Z1231 Encounter for screening mammogram for malignant neoplasm of breast: Secondary | ICD-10-CM

## 2021-09-28 ENCOUNTER — Other Ambulatory Visit: Payer: Self-pay | Admitting: Emergency Medicine

## 2021-09-28 DIAGNOSIS — J302 Other seasonal allergic rhinitis: Secondary | ICD-10-CM

## 2021-09-28 MED ORDER — LEVOCETIRIZINE DIHYDROCHLORIDE 5 MG PO TABS: 5.0000 mg | ORAL_TABLET | Freq: Every day | ORAL | 1 refills | Status: DC

## 2021-10-07 ENCOUNTER — Other Ambulatory Visit: Payer: Self-pay | Admitting: Family Medicine

## 2021-10-07 DIAGNOSIS — E559 Vitamin D deficiency, unspecified: Secondary | ICD-10-CM

## 2021-10-07 DIAGNOSIS — M816 Localized osteoporosis [Lequesne]: Secondary | ICD-10-CM

## 2021-10-19 ENCOUNTER — Ambulatory Visit: Payer: Managed Care, Other (non HMO) | Admitting: Family Medicine

## 2021-10-30 ENCOUNTER — Other Ambulatory Visit: Payer: Self-pay

## 2021-10-30 ENCOUNTER — Ambulatory Visit (INDEPENDENT_AMBULATORY_CARE_PROVIDER_SITE_OTHER): Payer: Managed Care, Other (non HMO) | Admitting: Family Medicine

## 2021-10-30 ENCOUNTER — Encounter: Payer: Self-pay | Admitting: Family Medicine

## 2021-10-30 VITALS — BP 124/72 | HR 79 | Temp 98.3°F | Resp 16 | Ht 62.0 in | Wt 189.0 lb

## 2021-10-30 DIAGNOSIS — R350 Frequency of micturition: Secondary | ICD-10-CM

## 2021-10-30 LAB — POCT URINALYSIS DIPSTICK
Bilirubin, UA: NEGATIVE
Glucose, UA: NEGATIVE
Ketones, UA: NEGATIVE
Nitrite, UA: NEGATIVE
Protein, UA: POSITIVE — AB
Spec Grav, UA: 1.01 (ref 1.010–1.025)
Urobilinogen, UA: 0.2 E.U./dL
pH, UA: 5 (ref 5.0–8.0)

## 2021-10-30 MED ORDER — NITROFURANTOIN MONOHYD MACRO 100 MG PO CAPS: 100.0000 mg | ORAL_CAPSULE | Freq: Two times a day (BID) | ORAL | 0 refills | Status: DC

## 2021-10-30 MED ORDER — NITROFURANTOIN MONOHYD MACRO 100 MG PO CAPS: 100.0000 mg | ORAL_CAPSULE | Freq: Two times a day (BID) | ORAL | 0 refills | Status: AC

## 2021-10-30 NOTE — Progress Notes (Signed)
? ?  SUBJECTIVE:  ? ?CHIEF COMPLAINT / HPI:  ? ?URINARY SYMPTOMS ?- symptom onset this morning ? ?Dysuria: yes ?Urinary frequency: yes ?Urgency: yes ?Urinary incontinence: no ?Hematuria: yes ?Abdominal pain: yes ?Back pain: no ?Suprapubic pain/pressure: no ?Flank pain: no ?Fever:  no ?Vomiting: no ?Relief with cranberry juice: hasn't tried ?Relief with pyridium: no ?Status: better/worse/stable ?Previous urinary tract infection: yes ?Vaginal discharge: no ?Treatments attempted: pyridium  ? ? ?OBJECTIVE:  ? ?BP 124/72   Pulse 79   Temp 98.3 ?F (36.8 ?C) (Oral)   Resp 16   Ht '5\' 2"'$  (1.575 m)   Wt 189 lb (85.7 kg)   SpO2 97%   BMI 34.57 kg/m?   ?Gen: well appearing, in NAD ?Card: Reg rate ?Lungs: Comfortable WOB on RA ?Ext: WWP ? ? ?ASSESSMENT/PLAN:  ? ?Urinary frequency ?UA consistent with infection, will send for culture. Rx macrobid x5 days. F/u prn.  ? ? ?Myles Gip, DO ?

## 2021-10-31 LAB — URINE CULTURE
MICRO NUMBER:: 13280266
Result:: NO GROWTH
SPECIMEN QUALITY:: ADEQUATE

## 2021-11-01 ENCOUNTER — Other Ambulatory Visit: Payer: Self-pay | Admitting: Nurse Practitioner

## 2021-11-01 ENCOUNTER — Telehealth: Payer: Self-pay | Admitting: Emergency Medicine

## 2021-11-01 DIAGNOSIS — B379 Candidiasis, unspecified: Secondary | ICD-10-CM

## 2021-11-01 MED ORDER — FLUCONAZOLE 150 MG PO TABS: 150.0000 mg | ORAL_TABLET | ORAL | 0 refills | Status: DC | PRN

## 2021-11-01 NOTE — Telephone Encounter (Signed)
Has yeast infection from antibiotics. Can diflucan be called in  ?

## 2021-11-05 ENCOUNTER — Ambulatory Visit
Admission: RE | Admit: 2021-11-05 | Discharge: 2021-11-05 | Disposition: A | Discharge status: Home / Self Care | Payer: Managed Care, Other (non HMO) | Source: Ambulatory Visit | Attending: Family Medicine | Admitting: Family Medicine

## 2021-11-05 DIAGNOSIS — M810 Age-related osteoporosis without current pathological fracture: Secondary | ICD-10-CM | POA: Insufficient documentation

## 2021-11-05 DIAGNOSIS — Z1231 Encounter for screening mammogram for malignant neoplasm of breast: Secondary | ICD-10-CM | POA: Insufficient documentation

## 2021-11-15 NOTE — Progress Notes (Signed)
Name: Amy May   MRN: [id number]    DOB: 14-Jan-1966   Date:11/16/2021 ? ?     Progress Note ? ?Subjective ? ?Chief Complaint ? ?Follow up  ? ?HPI ? ?HTN: she takes medication daily, bp is at goal, no chest pain, palpitation or sob. She states feeling better since walking on a regular basis She also lost some weight  ? ?Abnormal TLX:BWIO level normalized, reassurance given Unchanged  ? ?Obesity : HTN, hyperlipidemia, prediabetes , she is doing much better, lost 9 lbs from 2021 till 03/2021  still avoiding sodas,, walking 4 days a week for almost one hour, she was on  Ozempic ( out last week), BMI is now below 35. We will try another weight loss medication. Doing well  ? ?Vitamin D and B12 deficiency: we will recheck labs during her CPE ? ?Pre-diabetes: A1C was up to 6% but went down to 5.2 % with Ozempic. She has been walking 3 miles 4 days a week. She is still losing weight.  She states eating healthier and smaller portions. States insurance no longer covering Ozempic, we will try Wegovy to help her lose weight  ? ?Hyperlipidemia: not on medication, reviewed levels.  ? ?The 10-year ASCVD risk score (Arnett DK, et al., 2019) is: 4.2% ?  Values used to calculate the score: ?    Age: 56 years ?    Sex: Female ?    Is Non-Hispanic African American: Yes ?    Diabetic: No ?    Tobacco smoker: No ?    Systolic Blood Pressure: 035 mmHg ?    Is BP treated: Yes ?    HDL Cholesterol: 49 mg/dL ?    Total Cholesterol: 167 mg/dL  ? ?Plantar fascitis: wearing tennis shoes now, doing much better, taking Meloxicam, but advised to take it prn now, use ice on feet  ? ?Impingement shoulder syndrome and Knee pain: seen by Ortho, taking Meloxicam and is doing better  ? ?Osteoporosis of spine: she is taking Boniva and also vitamin D otc. We will recheck in 2 years , reviewed bone density and it has improved since started on treatment  ? ? ?Patient Active Problem List  ? Diagnosis Date Noted  ? Osteoporosis of lumbar spine 09/21/2020   ? Plantar fasciitis, bilateral 06/19/2020  ? Vitamin D deficiency 06/19/2020  ? B12 deficiency 06/19/2020  ? Paresthesia of skin 06/19/2020  ? Benign paroxysmal positional vertigo 06/19/2020  ? Bunion, right 06/19/2020  ? Acquired trigger finger of right middle finger 01/26/2020  ? Low vitamin D level 07/14/2018  ? Abnormal TSH 07/14/2018  ? Paroxysmal tachycardia ([place]) 07/06/2018  ? Abnormal EKG 07/05/2018  ? Palpitations 07/05/2018  ? Chronic right shoulder pain 04/30/2018  ? Body aches 04/30/2018  ? Essential hypertension 07/12/2015  ? Class 1 obesity due to excess calories with serious comorbidity and body mass index (BMI) of 32.0 to 32.9 in adult 07/12/2015  ? Hyperlipidemia 07/12/2015  ? Female genuine stress incontinence 11/10/2013  ? Acquired cyst of kidney 11/10/2013  ? ? ?Past Surgical History:  ?Procedure Laterality Date  ? COLONOSCOPY WITH PROPOFOL N/A 05/25/2018  ? Procedure: COLONOSCOPY WITH PROPOFOL;  Surgeon: Lin Landsman, MD;  Location: [facility] ENDOSCOPY;  Service: Gastroenterology;  Laterality: N/A;  ? DILATION AND CURETTAGE OF UTERUS    ? TUBAL LIGATION    ? ? ?Family History  ?Problem Relation Age of Onset  ? Heart disease Maternal Aunt   ? Breast cancer Neg Hx   ? ? ?  Social History  ? ?Tobacco Use  ? Smoking status: Never  ? Smokeless tobacco: Never  ?Substance Use Topics  ? Alcohol use: No  ?  Alcohol/week: 0.0 standard drinks  ? ? ? ?Current Outpatient Medications:  ?  albuterol (VENTOLIN HFA) 108 (90 Base) MCG/ACT inhaler, Inhale 2 puffs into the lungs every 6 (six) hours as needed., Disp: 18 g, Rfl: 1 ?  ibandronate (BONIVA) 150 MG tablet, Take 1 tablet (150 mg total) by mouth every 30 (thirty) days. Take in the morning with a full glass of water, on an empty stomach, and do not take anything else by mouth or lie down for the next 30 min., Disp: 12 tablet, Rfl: 3 ?  levocetirizine (XYZAL) 5 MG tablet, Take 1 tablet (5 mg total) by mouth daily., Disp: 90 tablet, Rfl: 1 ?  meloxicam  (MOBIC) 15 MG tablet, Take 1 tablet (15 mg total) by mouth daily., Disp: 90 tablet, Rfl: 1 ?  Semaglutide,0.25 or 0.'5MG'$ /DOS, (OZEMPIC, 0.25 OR 0.5 MG/DOSE,) 2 MG/1.5ML SOPN, Inject 0.5 mg into the skin once a week., Disp: 4.5 mL, Rfl: 0 ?  triamterene-hydrochlorothiazide (MAXZIDE-25) 37.5-25 MG tablet, Take 1 tablet by mouth daily., Disp: 90 tablet, Rfl: 1 ?  Vitamin D, Ergocalciferol, (DRISDOL) 1.25 MG (50000 UNIT) CAPS capsule, TAKE 1 CAPSULE (50,000 UNITS TOTAL) BY MOUTH EVERY 7 (SEVEN) DAYS, Disp: 4 capsule, Rfl: 0 ?  meclizine (ANTIVERT) 25 MG tablet, Take 0.5-1 tablets (12.5-25 mg total) by mouth 3 (three) times daily as needed for dizziness. (Patient not taking: Reported on 11/16/2021), Disp: 20 tablet, Rfl: 1 ? ?No Known Allergies ? ?I personally reviewed active problem list, medication list, allergies, family history with the patient/caregiver today. ? ? ?ROS ? ?Constitutional: Negative for fever, positive for mild  weight change.  ?Respiratory: Negative for cough and shortness of breath.   ?Cardiovascular: Negative for chest pain or palpitations.  ?Gastrointestinal: Negative for abdominal pain, no bowel changes.  ?Musculoskeletal: Negative for gait problem or joint swelling.  ?Skin: Negative for rash.  ?Neurological: Negative for dizziness or headache.  ?No other specific complaints in a complete review of systems (except as listed in HPI above).  ? ?Objective ? ?Vitals:  ? 11/16/21 1153  ?BP: 122/76  ?Pulse: 97  ?Resp: 16  ?SpO2: 98%  ?Weight: 189 lb (85.7 kg)  ?Height: '5\' 2"'$  (1.575 m)  ? ? ?Body mass index is 34.57 kg/m?. ? ?Physical Exam ? ?Constitutional: Patient appears well-developed and well-nourished. Obese  No distress.  ?HEENT: head atraumatic, normocephalic, pupils equal and reactive to light, neck supple ?Cardiovascular: Normal rate, regular rhythm and normal heart sounds.  No murmur heard. No BLE edema. ?Pulmonary/Chest: Effort normal and breath sounds normal. No respiratory  distress. ?Abdominal: Soft.  There is no tenderness ?Psychiatric: Patient has a normal mood and affect. behavior is normal. Judgment and thought content normal.  ? ?Recent Results (from the past 2160 hour(s))  ?POCT urinalysis dipstick     Status: Abnormal  ? Collection Time: 10/30/21 11:05 AM  ?Result Value Ref Range  ? Color, UA red   ? Clarity, UA cloudy   ? Glucose, UA Negative Negative  ? Bilirubin, UA negative   ? Ketones, UA negative   ? Spec Grav, UA 1.010 1.010 - 1.025  ? Blood, UA large   ? pH, UA 5.0 5.0 - 8.0  ? Protein, UA Positive (A) Negative  ? Urobilinogen, UA 0.2 0.2 or 1.0 E.U./dL  ? Nitrite, UA negative   ? Leukocytes, UA  Large (3+) (A) Negative  ? Appearance cloudy   ? Odor none   ?Urine Culture     Status: None  ? Collection Time: 10/30/21 11:17 AM  ? Specimen: Urine  ?Result Value Ref Range  ? MICRO NUMBER: [id number]   ? SPECIMEN QUALITY: Adequate   ? Sample Source URINE   ? STATUS: FINAL   ? Result: No Growth   ? ? ? ?PHQ2/9: ? ?  11/16/2021  ? 11:54 AM 10/30/2021  ? 10:55 AM 04/13/2021  ?  7:35 AM 02/20/2021  ?  8:06 AM 10/13/2020  ?  7:49 AM  ?Depression screen PHQ 2/9  ?Decreased Interest 0 0 0 0 0  ?Down, Depressed, Hopeless 0 0 0 0 0  ?PHQ - 2 Score 0 0 0 0 0  ?Altered sleeping 0      ?Tired, decreased energy 0      ?Change in appetite 0      ?Feeling bad or failure about yourself  0      ?Trouble concentrating 0      ?Moving slowly or fidgety/restless 0      ?Suicidal thoughts 0      ?PHQ-9 Score 0      ?  ?phq 9 is negative ? ? ?Fall Risk: ? ?  11/16/2021  ? 11:54 AM 10/30/2021  ? 10:54 AM 04/13/2021  ?  7:34 AM 02/20/2021  ?  8:06 AM 10/13/2020  ?  7:49 AM  ?Fall Risk   ?Falls in the past year? 0 0 0 0 0  ?Number falls in past yr: 0 0 0 0 0  ?Injury with Fall? 0 0 0 0 0  ?Risk for fall due to : No Fall Risks  No Fall Risks    ?Follow up Falls prevention discussed Falls evaluation completed Falls prevention discussed Falls evaluation completed   ? ? ? ? ?Functional Status Survey: ?Is the patient deaf  or have difficulty hearing?: No ?Does the patient have difficulty seeing, even when wearing glasses/contacts?: No ?Does the patient have difficulty concentrating, remembering, or making decisions?: No ?Does the patient have dif

## 2021-11-16 ENCOUNTER — Other Ambulatory Visit: Payer: Self-pay | Admitting: Family Medicine

## 2021-11-16 ENCOUNTER — Ambulatory Visit (INDEPENDENT_AMBULATORY_CARE_PROVIDER_SITE_OTHER): Payer: Managed Care, Other (non HMO) | Admitting: Family Medicine

## 2021-11-16 ENCOUNTER — Encounter: Payer: Self-pay | Admitting: Family Medicine

## 2021-11-16 VITALS — BP 122/76 | HR 97 | Resp 16 | Ht 62.0 in | Wt 189.0 lb

## 2021-11-16 DIAGNOSIS — E559 Vitamin D deficiency, unspecified: Secondary | ICD-10-CM

## 2021-11-16 DIAGNOSIS — M25561 Pain in right knee: Secondary | ICD-10-CM

## 2021-11-16 DIAGNOSIS — G8929 Other chronic pain: Secondary | ICD-10-CM

## 2021-11-16 DIAGNOSIS — M810 Age-related osteoporosis without current pathological fracture: Secondary | ICD-10-CM | POA: Diagnosis not present

## 2021-11-16 DIAGNOSIS — E782 Mixed hyperlipidemia: Secondary | ICD-10-CM

## 2021-11-16 DIAGNOSIS — I10 Essential (primary) hypertension: Secondary | ICD-10-CM

## 2021-11-16 DIAGNOSIS — M816 Localized osteoporosis [Lequesne]: Secondary | ICD-10-CM

## 2021-11-16 DIAGNOSIS — E669 Obesity, unspecified: Secondary | ICD-10-CM | POA: Diagnosis not present

## 2021-11-16 DIAGNOSIS — M7542 Impingement syndrome of left shoulder: Secondary | ICD-10-CM

## 2021-11-16 DIAGNOSIS — M25562 Pain in left knee: Secondary | ICD-10-CM

## 2021-11-16 DIAGNOSIS — E538 Deficiency of other specified B group vitamins: Secondary | ICD-10-CM

## 2021-11-16 DIAGNOSIS — R7303 Prediabetes: Secondary | ICD-10-CM

## 2021-11-16 DIAGNOSIS — Z124 Encounter for screening for malignant neoplasm of cervix: Secondary | ICD-10-CM

## 2021-11-16 MED ORDER — WEGOVY 0.5 MG/0.5ML ~~LOC~~ SOAJ: 0.5000 mg | SUBCUTANEOUS | 0 refills | Status: DC

## 2021-11-16 MED ORDER — IBANDRONATE SODIUM 150 MG PO TABS: 150.0000 mg | ORAL_TABLET | ORAL | 3 refills | Status: DC

## 2021-11-16 MED ORDER — VITAMIN D (ERGOCALCIFEROL) 1.25 MG (50000 UNIT) PO CAPS: 50000.0000 [IU] | ORAL_CAPSULE | ORAL | 0 refills | Status: DC

## 2021-11-16 MED ORDER — TRIAMTERENE-HCTZ 37.5-25 MG PO TABS: 1.0000 | ORAL_TABLET | Freq: Every day | ORAL | 1 refills | Status: DC

## 2021-11-16 NOTE — Assessment & Plan Note (Signed)
She has lost weight while on Ozempic but no longer covered by insurance, we will try Wegovy  ?

## 2021-11-16 NOTE — Assessment & Plan Note (Signed)
ASCVD is 4.2 % continue life style modification  ?

## 2021-11-16 NOTE — Assessment & Plan Note (Signed)
Doing well on trimaterene-HCTZ ?

## 2021-11-16 NOTE — Telephone Encounter (Signed)
PA in progress. 

## 2021-11-16 NOTE — Telephone Encounter (Signed)
Requested medication (s) are due for refill today:   Prescribed today by Dr. Ancil Boozer ? ?Requested medication (s) are on the active medication list:   Yes ? ?Future visit scheduled:   Yes ? ? ?Last ordered: Today 5/5 ? ?Returned because pharmacy requesting a PA.    ? ?Requested Prescriptions  ?Pending Prescriptions Disp Refills  ? WEGOVY 0.5 MG/0.5ML SOAJ [Pharmacy Med Name: WEGOVY 0.5 MG/0.5 ML PEN]  0  ?  Sig: Inject 0.5 mg into the skin once a week.  ?  ? Endocrinology:  Diabetes - GLP-1 Receptor Agonists - semaglutide Failed - 11/16/2021 12:10 PM  ?  ?  Failed - HBA1C in normal range and within 180 days  ?  Hgb A1c MFr Bld  ?Date Value Ref Range Status  ?04/13/2021 5.2 <5.7 % of total Hgb Final  ?  Comment:  ?  For the purpose of screening for the presence of ?diabetes: ?. ?<5.7%       Consistent with the absence of diabetes ?5.7-6.4%    Consistent with increased risk for diabetes ?            (prediabetes) ?> or =6.5%  Consistent with diabetes ?Marland Kitchen ?This assay result is consistent with a decreased risk ?of diabetes. ?. ?Currently, no consensus exists regarding use of ?hemoglobin A1c for diagnosis of diabetes in children. ?. ?According to American Diabetes Association (ADA) ?guidelines, hemoglobin A1c <7.0% represents optimal ?control in non-pregnant diabetic patients. Different ?metrics may apply to specific patient populations.  ?Standards of Medical Care in Diabetes(ADA). ?. ?  ?  ?  ?  ?  Passed - Cr in normal range and within 360 days  ?  Creat  ?Date Value Ref Range Status  ?04/13/2021 0.95 0.50 - 1.03 mg/dL Final  ?  ?  ?  ?  Passed - Valid encounter within last 6 months  ?  Recent Outpatient Visits   ? ?      ? Today Localized osteoporosis without current pathological fracture  ? [facility] [place], Drue Stager, MD  ? 2 weeks ago Frequent urination  ? [place], DO  ? 7 months ago Essential hypertension  ? [facility] Steele Sizer,  MD  ? 8 months ago Erroneous encounter - disregard  ? [facility] Myles Gip, DO  ? 1 year ago Well adult exam  ? [facility] Steele Sizer, MD  ? ?  ?  ?Future Appointments   ? ?        ? In 3 months Steele Sizer, MD [facility], [place]  ? In 6 months Steele Sizer, MD [facility], [place]  ? ?  ? ? ?  ?  ?  ? ?

## 2021-11-16 NOTE — Telephone Encounter (Signed)
PA initiated

## 2021-11-16 NOTE — Assessment & Plan Note (Signed)
Last bone density showed improvement ?Started Boniva in 2022 no side effects ?

## 2021-12-03 ENCOUNTER — Other Ambulatory Visit: Payer: Self-pay

## 2021-12-03 DIAGNOSIS — R7303 Prediabetes: Secondary | ICD-10-CM

## 2021-12-03 DIAGNOSIS — E669 Obesity, unspecified: Secondary | ICD-10-CM

## 2021-12-03 MED ORDER — SEMAGLUTIDE (1 MG/DOSE) 4 MG/3ML ~~LOC~~ SOPN: 1.0000 mg | PEN_INJECTOR | SUBCUTANEOUS | 2 refills | Status: DC

## 2022-01-29 ENCOUNTER — Ambulatory Visit: Payer: Managed Care, Other (non HMO) | Admitting: Family Medicine

## 2022-01-29 NOTE — Progress Notes (Unsigned)
Name: Amy May   MRN: [id number]    DOB: [birth year]   Date:01/30/2022       Progress Note  Subjective  Chief Complaint  ER Follow Up  HPI  Left renal cyst: went to [facility] on 07/16 because of sharp pain on left flank area , intermittently the entire day. She took Tums all day to see if it would help. She woke up in the middle of the night with intense pain and after speaking to triage nurse she went to [facility] . US showed mild increase in size. She was seeing Urologist at [facility] however no longer in network and needs to see someone else for further evaluation. The pain is still happening occasionally but not as intense - she states pain radiates to upper abdomen . She takes Ozempic and we will check for pancreatitis also   No dysuria, fever, nausea or vomiting, no change in bowel movements. Reviewed CBC , urinalysis and comp panel. Calcium was elevated   Patient Active Problem List   Diagnosis Date Noted   Prediabetes 11/16/2021   Osteoporosis of lumbar spine 09/21/2020   Plantar fasciitis, bilateral 06/19/2020   Vitamin D deficiency 06/19/2020   B12 deficiency 06/19/2020   Paresthesia of skin 06/19/2020   Benign paroxysmal positional vertigo 06/19/2020   Bunion, right 06/19/2020   Acquired trigger finger of right middle finger 01/26/2020   Low vitamin D level 07/14/2018   Abnormal TSH 07/14/2018   Paroxysmal tachycardia (HCC) 07/06/2018   Abnormal EKG 07/05/2018   Palpitations 07/05/2018   Chronic right shoulder pain 04/30/2018   Body aches 04/30/2018   Essential hypertension 07/12/2015   Obesity (BMI 30.0-34.9) 07/12/2015   Hyperlipidemia 07/12/2015   Female genuine stress incontinence 11/10/2013   Acquired cyst of kidney 11/10/2013    Past Surgical History:  Procedure Laterality Date   COLONOSCOPY WITH PROPOFOL N/A 05/25/2018   Procedure: COLONOSCOPY WITH PROPOFOL;  Surgeon: Lin Landsman, MD;  Location: [facility] ENDOSCOPY;  Service: Gastroenterology;  Laterality: N/A;    DILATION AND CURETTAGE OF UTERUS     TUBAL LIGATION      Family History  Problem Relation Age of Onset   Heart disease Maternal Aunt    Breast cancer Neg Hx     Social History   Tobacco Use   Smoking status: Never   Smokeless tobacco: Never  Substance Use Topics   Alcohol use: No    Alcohol/week: 0.0 standard drinks of alcohol     Current Outpatient Medications:    albuterol (VENTOLIN HFA) 108 (90 Base) MCG/ACT inhaler, Inhale 2 puffs into the lungs every 6 (six) hours as needed., Disp: 18 g, Rfl: 1   ibandronate (BONIVA) 150 MG tablet, Take 1 tablet (150 mg total) by mouth every 30 (thirty) days. Take in the morning with a full glass of water, on an empty stomach, and do not take anything else by mouth or lie down for the next 30 min., Disp: 12 tablet, Rfl: 3   levocetirizine (XYZAL) 5 MG tablet, Take 1 tablet (5 mg total) by mouth daily., Disp: 90 tablet, Rfl: 1   meclizine (ANTIVERT) 25 MG tablet, Take 0.5-1 tablets (12.5-25 mg total) by mouth 3 (three) times daily as needed for dizziness., Disp: 20 tablet, Rfl: 1   meloxicam (MOBIC) 15 MG tablet, Take 1 tablet (15 mg total) by mouth daily., Disp: 90 tablet, Rfl: 1   Semaglutide, 1 MG/DOSE, 4 MG/3ML SOPN, Inject 1 mg as directed once a week., Disp: 3 mL, Rfl:  2   triamterene-hydrochlorothiazide (MAXZIDE-25) 37.5-25 MG tablet, Take 1 tablet by mouth daily., Disp: 90 tablet, Rfl: 1   Vitamin D, Ergocalciferol, (DRISDOL) 1.25 MG (50000 UNIT) CAPS capsule, Take 1 capsule (50,000 Units total) by mouth every 7 (seven) days., Disp: 12 capsule, Rfl: 0  No Known Allergies  I personally reviewed active problem list, medication list, allergies, family history, social history, health maintenance with the patient/caregiver today.   ROS  Ten systems reviewed and is negative except as mentioned in HPI   Objective  Vitals:   01/30/22 0756  BP: 122/80  Pulse: 95  Resp: 16  SpO2: 99%  Weight: 188 lb (85.3 kg)  Height: '5\' 2"'$  (1.575  m)    Body mass index is 34.39 kg/m.  Physical Exam  Constitutional: Patient appears well-developed and well-nourished. Obese  No distress.  HEENT: head atraumatic, normocephalic, pupils equal and reactive to ligh, neck supple Cardiovascular: Normal rate, regular rhythm and normal heart sounds.  No murmur heard. No BLE edema. Pulmonary/Chest: Effort normal and breath sounds normal. No respiratory distress. Abdominal: Soft.  There is no tenderness. No masses, negative CVA tenderness  Psychiatric: Patient has a normal mood and affect. behavior is normal. Judgment and thought content normal.   PHQ2/9:    01/30/2022    7:56 AM 11/16/2021   11:54 AM 10/30/2021   10:55 AM 04/13/2021    7:35 AM 02/20/2021    8:06 AM  Depression screen PHQ 2/9  Decreased Interest 0 0 0 0 0  Down, Depressed, Hopeless 0 0 0 0 0  PHQ - 2 Score 0 0 0 0 0  Altered sleeping  0     Tired, decreased energy  0     Change in appetite  0     Feeling bad or failure about yourself   0     Trouble concentrating  0     Moving slowly or fidgety/restless  0     Suicidal thoughts  0     PHQ-9 Score  0       phq 9 is negative   Fall Risk:    01/30/2022    7:56 AM 11/16/2021   11:54 AM 10/30/2021   10:54 AM 04/13/2021    7:34 AM 02/20/2021    8:06 AM  Fall Risk   Falls in the past year? 0 0 0 0 0  Number falls in past yr: 0 0 0 0 0  Injury with Fall? 0 0 0 0 0  Risk for fall due to : No Fall Risks No Fall Risks  No Fall Risks   Follow up Falls prevention discussed Falls prevention discussed Falls evaluation completed Falls prevention discussed Falls evaluation completed      Functional Status Survey: Is the patient deaf or have difficulty hearing?: No Does the patient have difficulty seeing, even when wearing glasses/contacts?: No Does the patient have difficulty concentrating, remembering, or making decisions?: No Does the patient have difficulty walking or climbing stairs?: No Does the patient have difficulty  dressing or bathing?: No Does the patient have difficulty doing errands alone such as visiting a doctor's office or shopping?: No    Assessment & Plan  1. Renal cyst, left  - Ambulatory referral to Urology    2. Hypercalcemia  - Calcium, ionized   3. Left flank pain  - Lipase

## 2022-01-30 ENCOUNTER — Ambulatory Visit (INDEPENDENT_AMBULATORY_CARE_PROVIDER_SITE_OTHER): Payer: Managed Care, Other (non HMO) | Admitting: Family Medicine

## 2022-01-30 ENCOUNTER — Encounter: Payer: Self-pay | Admitting: Family Medicine

## 2022-01-30 VITALS — BP 122/80 | HR 95 | Resp 16 | Ht 62.0 in | Wt 188.0 lb

## 2022-01-30 DIAGNOSIS — R109 Unspecified abdominal pain: Secondary | ICD-10-CM

## 2022-01-30 DIAGNOSIS — N281 Cyst of kidney, acquired: Secondary | ICD-10-CM | POA: Diagnosis not present

## 2022-01-31 LAB — LIPASE: Lipase: 50 U/L (ref 7–60)

## 2022-01-31 LAB — CALCIUM, IONIZED: Calcium, Ion: 5.2 mg/dL (ref 4.7–5.5)

## 2022-02-06 ENCOUNTER — Ambulatory Visit (INDEPENDENT_AMBULATORY_CARE_PROVIDER_SITE_OTHER): Payer: Managed Care, Other (non HMO) | Admitting: Urology

## 2022-02-06 ENCOUNTER — Encounter: Payer: Self-pay | Admitting: Urology

## 2022-02-06 VITALS — BP 130/85 | HR 80 | Ht 62.0 in | Wt 185.0 lb

## 2022-02-06 DIAGNOSIS — N281 Cyst of kidney, acquired: Secondary | ICD-10-CM

## 2022-02-06 NOTE — Progress Notes (Signed)
02/06/22 12:41 PM   Amy May Sep 12, 1965 [id number]  CC: Left renal cyst, left flank pain  HPI: I saw Amy May for evaluation of the above.  She is a 56 year old female who has had a known large left renal cyst since at least 2007, when it measured 10 cm on abdominal ultrasound at that time.  It sounds like it has been followed intermittently by urology since that time, and overall has been essentially stable, with most recent renal ultrasound 01/27/2022 showing simple left renal cyst measuring 13 cm.  There has never been any evidence of solid component or enhancement.  She had some left-sided flank pain and presented to the ER on 01/27/2022, no definitive etiology of her pain was identified and she was recommended to have urology follow-up for the large left renal cyst.  Her pain is since resolved and she really denies any complaints or urinary symptoms today.  She denies any gross hematuria or dysuria.   PMH: Past Medical History:  Diagnosis Date   Allergy    Hypertension     Surgical History: Past Surgical History:  Procedure Laterality Date   COLONOSCOPY WITH PROPOFOL N/A 05/25/2018   Procedure: COLONOSCOPY WITH PROPOFOL;  Surgeon: Lin Landsman, MD;  Location: [facility] ENDOSCOPY;  Service: Gastroenterology;  Laterality: N/A;   DILATION AND CURETTAGE OF UTERUS     TUBAL LIGATION       Family History: Family History  Problem Relation Age of Onset   Heart disease Maternal Aunt    Breast cancer Neg Hx     Social History:  reports that she has never smoked. She has never used smokeless tobacco. She reports that she does not drink alcohol and does not use drugs.  Physical Exam: BP 130/85 (BP Location: Left Arm, Patient Position: Sitting, Cuff Size: Large)   Pulse 80   Ht '5\' 2"'$  (1.575 m)   Wt 185 lb (83.9 kg)   BMI 33.84 kg/m    Constitutional:  Alert and oriented, No acute distress. Cardiovascular: No clubbing, cyanosis, or edema. Respiratory: Normal  respiratory effort, no increased work of breathing. GI: Abdomen is soft, nontender, nondistended, no abdominal masses GU: No CVA tenderness  Laboratory Data: Reviewed  Pertinent Imaging: I have personally viewed and interpreted the multiple prior renal ultrasounds and CT showing a simple left renal cyst essentially stable from 2007 and measuring 13 cm on recent ultrasound from 11 cm at time of diagnosis in 2007.  Assessment & Plan:   56 year old female with a essentially stable large left simple renal cyst for at least the last 15 years that has been imaged with renal ultrasounds and CTs.  There is no evidence of solid component or enhancement that would be worrisome for malignancy.  We discussed that this cyst is benign and has been essentially stable for 15+ years and would be unlikely to be the cause of her left-sided pain that has since resolved.  We discussed possible options including drainage of the cyst with interventional radiology, but that carries a risk of bleeding, infection, and recurrence, and I think would be unnecessary with her lack of symptoms and stability of the cyst over the last 15+ years.  Reassurance was provided at length.  We reviewed the Bosniak cyst classification system and that no routine imaging would be required for her simple cyst, especially in the setting of long-term stability.  Outside urology notes and images were reviewed extensively.  -RTC 1 year symptom check, likely can follow-up as needed  at that time if doing well -If recurrent left-sided flank pain could consider referral to interventional radiology for drainage/sclerosis, but I feel this is very unlikely to be the etiology of her pain as cyst has been essentially stable in size for the last 15+  years   Nickolas Madrid, MD 02/06/2022  [facility] [address] [place], [place] [address] [phone]

## 2022-02-06 NOTE — Patient Instructions (Signed)
You have a large simple cyst on your left kidney.  This has been essentially stable for the last 15+ years and has not grown or changed significantly.  These do not require regular imaging.  If you develop worsening left-sided side or back pain that is persistent and does not improve with rest or anti-inflammatory/Tylenol, we are always happy to see you to consider drainage of the cyst.  It is difficult to tell if patient's pain or symptoms are from a kidney cyst or musculoskeletal issues/chronic back/side pain.  Any procedure would have risks of bleeding or infection, with no guarantee of improvement of the discomfort or side pain.  Fortunately these cysts do not turn into tumors or cancers, and do not need to be monitored yearly, especially when they have been stable for over 10 years on imaging.  The risk of bleeding or rupture of the cyst would be extremely low, but if you have severe left-sided flank or back pain, please call the clinic, and we are happy to see you.

## 2022-02-08 ENCOUNTER — Other Ambulatory Visit: Payer: Self-pay | Admitting: Family Medicine

## 2022-02-08 DIAGNOSIS — E559 Vitamin D deficiency, unspecified: Secondary | ICD-10-CM

## 2022-02-08 DIAGNOSIS — M816 Localized osteoporosis [Lequesne]: Secondary | ICD-10-CM

## 2022-02-25 ENCOUNTER — Encounter: Payer: Self-pay | Admitting: Family Medicine

## 2022-02-25 ENCOUNTER — Ambulatory Visit (INDEPENDENT_AMBULATORY_CARE_PROVIDER_SITE_OTHER): Payer: Managed Care, Other (non HMO) | Admitting: Family Medicine

## 2022-02-25 ENCOUNTER — Other Ambulatory Visit: Payer: Self-pay | Admitting: Family Medicine

## 2022-02-25 ENCOUNTER — Other Ambulatory Visit (HOSPITAL_COMMUNITY)
Admission: RE | Admit: 2022-02-25 | Discharge: 2022-02-25 | Disposition: A | Discharge status: Home / Self Care | Payer: Managed Care, Other (non HMO) | Source: Ambulatory Visit | Attending: Family Medicine | Admitting: Family Medicine

## 2022-02-25 VITALS — BP 122/68 | HR 93 | Resp 16 | Ht 62.0 in | Wt 188.0 lb

## 2022-02-25 DIAGNOSIS — E538 Deficiency of other specified B group vitamins: Secondary | ICD-10-CM | POA: Diagnosis not present

## 2022-02-25 DIAGNOSIS — Z0000 Encounter for general adult medical examination without abnormal findings: Secondary | ICD-10-CM

## 2022-02-25 DIAGNOSIS — E559 Vitamin D deficiency, unspecified: Secondary | ICD-10-CM | POA: Diagnosis not present

## 2022-02-25 DIAGNOSIS — Z124 Encounter for screening for malignant neoplasm of cervix: Secondary | ICD-10-CM

## 2022-02-25 DIAGNOSIS — E782 Mixed hyperlipidemia: Secondary | ICD-10-CM

## 2022-02-25 DIAGNOSIS — R7303 Prediabetes: Secondary | ICD-10-CM

## 2022-02-25 NOTE — Progress Notes (Signed)
Name: Amy May   MRN: [id number]    DOB: 12-Jul-1966   Date:02/25/2022       Progress Note  Subjective  Chief Complaint  Annual Exam  HPI  Patient presents for annual CPE.  Diet: cooks her food, packs lunch for work Exercise: continue regular follow up  Last Eye Exam: 2022 Last Dental Exam: she is due for follow up  Rosita Visit from 10/30/2021 in [facility]  AUDIT-C Score 0      Depression: Phq 9 is  negative    02/25/2022    1:20 PM 01/30/2022    7:56 AM 11/16/2021   11:54 AM 10/30/2021   10:55 AM 04/13/2021    7:35 AM  Depression screen PHQ 2/9  Decreased Interest 0 0 0 0 0  Down, Depressed, Hopeless 0 0 0 0 0  PHQ - 2 Score 0 0 0 0 0  Altered sleeping 0  0    Tired, decreased energy 0  0    Change in appetite 0  0    Feeling bad or failure about yourself  0  0    Trouble concentrating 0  0    Moving slowly or fidgety/restless 0  0    Suicidal thoughts 0  0    PHQ-9 Score 0  0     Hypertension: BP Readings from Last 3 Encounters:  02/25/22 122/68  02/06/22 130/85  01/30/22 122/80   Obesity: Wt Readings from Last 3 Encounters:  02/25/22 188 lb (85.3 kg)  02/06/22 185 lb (83.9 kg)  01/30/22 188 lb (85.3 kg)   BMI Readings from Last 3 Encounters:  02/25/22 34.39 kg/m  02/06/22 33.84 kg/m  01/30/22 34.39 kg/m     Vaccines:   Tdap: up to date - 2019 Shingrix: up to date Pneumonia: 2020  Flu: 2021, she gets it yearly at work  COVID-19:discussed bivalent booster    Hep C Screening: 06/17/18 STD testing and prevention (HIV/chl/gon/syphilis): 04/30/18 Intimate partner violence: negative screen  Sexual History : not sexually active for a long time  Menstrual History/LMP/Abnormal Bleeding: menopause since age 47  Discussed importance of follow up if any post-menopausal bleeding: yes  Incontinence Symptoms: negative for symptoms   Breast cancer:  - Last Mammogram: 11/05/21 - BRCA gene screening:  N/A  Osteoporosis Prevention : Discussed high calcium and vitamin D supplementation, weight bearing exercises Bone density: 11/05/21 improved, taking Boniva as prescribed   Cervical cancer screening: Today  Skin cancer: Discussed monitoring for atypical lesions  Colorectal cancer: 05/25/18   Lung cancer:  Low Dose CT Chest recommended if Age 25-80 years, 20 pack-year currently smoking OR have quit w/in 15years. Patient does not qualify for screen   ECG: 08/25/19  Advanced Care Planning: A voluntary discussion about advance care planning including the explanation and discussion of advance directives.  Discussed health care proxy and Living will, and the patient was able to identify a health care proxy as daughter Junie Panning .  Patient does not have a living will and power of attorney of health care   Lipids: Lab Results  Component Value Date   CHOL 167 04/13/2021   CHOL 173 06/19/2020   CHOL 178 06/24/2019   Lab Results  Component Value Date   HDL 49 (L) 04/13/2021   HDL 48 (L) 06/19/2020   HDL 46 (L) 06/24/2019   Lab Results  Component Value Date   LDLCALC 96 04/13/2021   LDLCALC 99 06/19/2020   LDLCALC 104 (  H) 06/24/2019   Lab Results  Component Value Date   TRIG 127 04/13/2021   TRIG 166 (H) 06/19/2020   TRIG 165 (H) 06/24/2019   Lab Results  Component Value Date   CHOLHDL 3.4 04/13/2021   CHOLHDL 3.6 06/19/2020   CHOLHDL 3.9 06/24/2019   No results found for: "LDLDIRECT"  Glucose: Glucose, Bld  Date Value Ref Range Status  04/13/2021 82 65 - 99 mg/dL Final    Comment:    .            Fasting reference interval .   06/19/2020 82 65 - 99 mg/dL Final    Comment:    .            Fasting reference interval .   06/24/2019 97 65 - 99 mg/dL Final    Comment:    .            Fasting reference interval .     Patient Active Problem List   Diagnosis Date Noted   Prediabetes 11/16/2021   Osteoporosis of lumbar spine 09/21/2020   Plantar fasciitis, bilateral  06/19/2020   Vitamin D deficiency 06/19/2020   B12 deficiency 06/19/2020   Paresthesia of skin 06/19/2020   Benign paroxysmal positional vertigo 06/19/2020   Bunion, right 06/19/2020   Acquired trigger finger of right middle finger 01/26/2020   Low vitamin D level 07/14/2018   Abnormal TSH 07/14/2018   Paroxysmal tachycardia ([place]) 07/06/2018   Abnormal EKG 07/05/2018   Palpitations 07/05/2018   Chronic right shoulder pain 04/30/2018   Body aches 04/30/2018   Essential hypertension 07/12/2015   Obesity (BMI 30.0-34.9) 07/12/2015   Hyperlipidemia 07/12/2015   Female genuine stress incontinence 11/10/2013   Acquired cyst of kidney 11/10/2013    Past Surgical History:  Procedure Laterality Date   COLONOSCOPY WITH PROPOFOL N/A 05/25/2018   Procedure: COLONOSCOPY WITH PROPOFOL;  Surgeon: Lin Landsman, MD;  Location: [facility] ENDOSCOPY;  Service: Gastroenterology;  Laterality: N/A;   DILATION AND CURETTAGE OF UTERUS     TUBAL LIGATION      Family History  Problem Relation Age of Onset   Heart disease Maternal Aunt    Breast cancer Neg Hx     Social History   Socioeconomic History   Marital status: Divorced    Spouse name: Not on file   Number of children: 2   Years of education: Not on file   Highest education level: Not on file  Occupational History   Occupation: receptionist    Employer: [employer]  Tobacco Use   Smoking status: Never   Smokeless tobacco: Never  Vaping Use   Vaping Use: Never used  Substance and Sexual Activity   Alcohol use: No    Alcohol/week: 0.0 standard drinks of alcohol   Drug use: No   Sexual activity: Not Currently    Partners: Male  Other Topics Concern   Not on file  Social History Narrative   Not on file   Social Determinants of Health   Financial Resource Strain: Low Risk  (02/25/2022)   Overall Financial Resource Strain (CARDIA)    Difficulty of Paying Living Expenses: Not hard at all  Food Insecurity: No Food  Insecurity (02/25/2022)   Hunger Vital Sign    Worried About Running Out of Food in the Last Year: Never true    [place] in the Last Year: Never true  Transportation Needs: No Transportation Needs (02/25/2022)   [place] - Transportation  Lack of Transportation (Medical): No    Lack of Transportation (Non-Medical): No  Physical Activity: Sufficiently Active (02/25/2022)   Exercise Vital Sign    Days of Exercise per Week: 7 days    Minutes of Exercise per Session: 30 min  Stress: No Stress Concern Present (02/25/2022)   [place]    Feeling of Stress : Not at all  Social Connections: Moderately Integrated (02/25/2022)   Social Connection and Isolation Panel [NHANES]    Frequency of Communication with Friends and Family: More than three times a week    Frequency of Social Gatherings with Friends and Family: More than three times a week    Attends Religious Services: More than 4 times per year    Active Member of [employer] or Organizations: Yes    Attends Music therapist: More than 4 times per year    Marital Status: Divorced  Intimate Partner Violence: Not At Risk (02/25/2022)   Humiliation, Afraid, Rape, and Kick questionnaire    Fear of Current or Ex-Partner: No    Emotionally Abused: No    Physically Abused: No    Sexually Abused: No     Current Outpatient Medications:    albuterol (VENTOLIN HFA) 108 (90 Base) MCG/ACT inhaler, Inhale 2 puffs into the lungs every 6 (six) hours as needed., Disp: 18 g, Rfl: 1   ibandronate (BONIVA) 150 MG tablet, Take 1 tablet (150 mg total) by mouth every 30 (thirty) days. Take in the morning with a full glass of water, on an empty stomach, and do not take anything else by mouth or lie down for the next 30 min., Disp: 12 tablet, Rfl: 3   levocetirizine (XYZAL) 5 MG tablet, Take 1 tablet (5 mg total) by mouth daily., Disp: 90 tablet, Rfl: 1   meclizine (ANTIVERT) 25 MG  tablet, Take 0.5-1 tablets (12.5-25 mg total) by mouth 3 (three) times daily as needed for dizziness., Disp: 20 tablet, Rfl: 1   meloxicam (MOBIC) 15 MG tablet, Take 1 tablet (15 mg total) by mouth daily., Disp: 90 tablet, Rfl: 1   triamterene-hydrochlorothiazide (MAXZIDE-25) 37.5-25 MG tablet, Take 1 tablet by mouth daily., Disp: 90 tablet, Rfl: 1   Vitamin D, Ergocalciferol, (DRISDOL) 1.25 MG (50000 UNIT) CAPS capsule, TAKE 1 CAPSULE (50,000 UNITS TOTAL) BY MOUTH EVERY 7 (SEVEN) DAYS, Disp: 4 capsule, Rfl: 2  No Known Allergies   ROS  Constitutional: Negative for fever or weight change.  Respiratory: Negative for cough and shortness of breath.   Cardiovascular: Negative for chest pain or palpitations.  Gastrointestinal: Negative for abdominal pain, no bowel changes.  Musculoskeletal: Negative for gait problem or joint swelling.  Skin: Negative for rash.  Neurological: Negative for dizziness or headache.  No other specific complaints in a complete review of systems (except as listed in HPI above).   Objective  Vitals:   02/25/22 1320  BP: 122/68  Pulse: 93  Resp: 16  SpO2: 99%  Weight: 188 lb (85.3 kg)  Height: $Remove'5\' 2"'iuNSEOB$  (1.575 m)    Body mass index is 34.39 kg/m.  Physical Exam  Constitutional: Patient appears well-developed and well-nourished. No distress.  HENT: Head: Normocephalic and atraumatic. Ears: B TMs ok, no erythema or effusion; Nose: Nose normal. Mouth/Throat: Oropharynx is clear and moist. No oropharyngeal exudate.  Eyes: Conjunctivae and EOM are normal. Pupils are equal, round, and reactive to light. No scleral icterus.  Neck: Normal range of motion. Neck supple. No JVD present.  No thyromegaly present.  Cardiovascular: Normal rate, regular rhythm and normal heart sounds.  No murmur heard. No BLE edema. Pulmonary/Chest: Effort normal and breath sounds normal. No respiratory distress. Abdominal: Soft. Bowel sounds are normal, no distension. There is no tenderness.  no masses Breast: no lumps or masses, no nipple discharge or rashes FEMALE GENITALIA:  External genitalia normal External urethra normal Vaginal vault normal without discharge or lesions Cervix normal without discharge or lesions Bimanual exam normal without masses RECTAL: not done  Musculoskeletal: Normal range of motion, no joint effusions. No gross deformities Neurological: he is alert and oriented to person, place, and time. No cranial nerve deficit. Coordination, balance, strength, speech and gait are normal.  Skin: Skin is warm and dry. No rash noted. No erythema.  Psychiatric: Patient has a normal mood and affect. behavior is normal. Judgment and thought content normal.   Recent Results (from the past 2160 hour(s))  Calcium, ionized     Status: None   Collection Time: 01/30/22  8:17 AM  Result Value Ref Range   Calcium, Ion 5.2 4.7 - 5.5 mg/dL  Lipase     Status: None   Collection Time: 01/30/22  8:17 AM  Result Value Ref Range   Lipase 50 7 - 60 U/L     Fall Risk:    02/25/2022    1:20 PM 01/30/2022    7:56 AM 11/16/2021   11:54 AM 10/30/2021   10:54 AM 04/13/2021    7:34 AM  Fall Risk   Falls in the past year? 0 0 0 0 0  Number falls in past yr: 0 0 0 0 0  Injury with Fall? 0 0 0 0 0  Risk for fall due to : No Fall Risks No Fall Risks No Fall Risks  No Fall Risks  Follow up Falls prevention discussed Falls prevention discussed Falls prevention discussed Falls evaluation completed Falls prevention discussed     Functional Status Survey: Is the patient deaf or have difficulty hearing?: No Does the patient have difficulty seeing, even when wearing glasses/contacts?: No Does the patient have difficulty concentrating, remembering, or making decisions?: No Does the patient have difficulty walking or climbing stairs?: No Does the patient have difficulty dressing or bathing?: No Does the patient have difficulty doing errands alone such as visiting a doctor's office or  shopping?: No   Assessment & Plan  1. Well adult exam  - Lipid panel - Hemoglobin A1c - VITAMIN D 25 Hydroxy (Vit-D Deficiency, Fractures) - Vitamin B12 - Hepatic function panel  2. Cervical cancer screening  - Cytology - PAP  3. B12 deficiency  - Vitamin B12  4. Vitamin D deficiency  - VITAMIN D 25 Hydroxy (Vit-D Deficiency, Fractures)  5. Prediabetes  - Hemoglobin A1c  6. Mixed hyperlipidemia  - Lipid panel    -USPSTF grade A and B recommendations reviewed with patient; age-appropriate recommendations, preventive care, screening tests, etc discussed and encouraged; healthy living encouraged; see AVS for patient education given to patient -Discussed importance of 150 minutes of physical activity weekly, eat two servings of fish weekly, eat one serving of tree nuts ( cashews, pistachios, pecans, almonds.Marland Kitchen) every other day, eat 6 servings of fruit/vegetables daily and drink plenty of water and avoid sweet beverages.   -Reviewed Health Maintenance: Yes.

## 2022-02-25 NOTE — Patient Instructions (Signed)
Preventive Care 40-57 Years Old, Female Preventive care refers to lifestyle choices and visits with your health care provider that can promote health and wellness. Preventive care visits are also called wellness exams. What can I expect for my preventive care visit? Counseling Your health care provider may ask you questions about your: Medical history, including: Past medical problems. Family medical history. Pregnancy history. Current health, including: Menstrual cycle. Method of birth control. Emotional well-being. Home life and relationship well-being. Sexual activity and sexual health. Lifestyle, including: Alcohol, nicotine or tobacco, and drug use. Access to firearms. Diet, exercise, and sleep habits. Work and work environment. Sunscreen use. Safety issues such as seatbelt and bike helmet use. Physical exam Your health care provider will check your: Height and weight. These may be used to calculate your BMI (body mass index). BMI is a measurement that tells if you are at a healthy weight. Waist circumference. This measures the distance around your waistline. This measurement also tells if you are at a healthy weight and may help predict your risk of certain diseases, such as type 2 diabetes and high blood pressure. Heart rate and blood pressure. Body temperature. Skin for abnormal spots. What immunizations do I need?  Vaccines are usually given at various ages, according to a schedule. Your health care provider will recommend vaccines for you based on your age, medical history, and lifestyle or other factors, such as travel or where you work. What tests do I need? Screening Your health care provider may recommend screening tests for certain conditions. This may include: Lipid and cholesterol levels. Diabetes screening. This is done by checking your blood sugar (glucose) after you have not eaten for a while (fasting). Pelvic exam and Pap test. Hepatitis B test. Hepatitis C  test. HIV (human immunodeficiency virus) test. STI (sexually transmitted infection) testing, if you are at risk. Lung cancer screening. Colorectal cancer screening. Mammogram. Talk with your health care provider about when you should start having regular mammograms. This may depend on whether you have a family history of breast cancer. BRCA-related cancer screening. This may be done if you have a family history of breast, ovarian, tubal, or peritoneal cancers. Bone density scan. This is done to screen for osteoporosis. Talk with your health care provider about your test results, treatment options, and if necessary, the need for more tests. Follow these instructions at home: Eating and drinking  Eat a diet that includes fresh fruits and vegetables, whole grains, lean protein, and low-fat dairy products. Take vitamin and mineral supplements as recommended by your health care provider. Do not drink alcohol if: Your health care provider tells you not to drink. You are pregnant, may be pregnant, or are planning to become pregnant. If you drink alcohol: Limit how much you have to 0-1 drink a day. Know how much alcohol is in your drink. In the [place] one drink equals one 12 oz bottle of beer (355 mL), one 5 oz glass of wine (148 mL), or one 1 oz glass of hard liquor (44 mL). Lifestyle Brush your teeth every morning and night with fluoride toothpaste. Floss one time each day. Exercise for at least 30 minutes 5 or more days each week. Do not use any products that contain nicotine or tobacco. These products include cigarettes, chewing tobacco, and vaping devices, such as e-cigarettes. If you need help quitting, ask your health care provider. Do not use drugs. If you are sexually active, practice safe sex. Use a condom or other form of protection to   prevent STIs. If you do not wish to become pregnant, use a form of birth control. If you plan to become pregnant, see your health care provider for a  prepregnancy visit. Take aspirin only as told by your health care provider. Make sure that you understand how much to take and what form to take. Work with your health care provider to find out whether it is safe and beneficial for you to take aspirin daily. Find healthy ways to manage stress, such as: Meditation, yoga, or listening to music. Journaling. Talking to a trusted person. Spending time with friends and family. Minimize exposure to UV radiation to reduce your risk of skin cancer. Safety Always wear your seat belt while driving or riding in a vehicle. Do not drive: If you have been drinking alcohol. Do not ride with someone who has been drinking. When you are tired or distracted. While texting. If you have been using any mind-altering substances or drugs. Wear a helmet and other protective equipment during sports activities. If you have firearms in your house, make sure you follow all gun safety procedures. Seek help if you have been physically or sexually abused. What's next? Visit your health care provider once a year for an annual wellness visit. Ask your health care provider how often you should have your eyes and teeth checked. Stay up to date on all vaccines. This information is not intended to replace advice given to you by your health care provider. Make sure you discuss any questions you have with your health care provider. Document Revised: 12/27/2020 Document Reviewed: 12/27/2020 Elsevier Patient Education  [place].

## 2022-02-26 ENCOUNTER — Telehealth: Payer: Self-pay | Admitting: Family Medicine

## 2022-02-26 ENCOUNTER — Encounter: Payer: Managed Care, Other (non HMO) | Admitting: Family Medicine

## 2022-02-26 LAB — LIPID PANEL
Cholesterol: 181 mg/dL (ref <200)
HDL: 52 mg/dL (ref >=50)
LDL Cholesterol (Calc): 97 mg/dL (calc)
Non-HDL Cholesterol (Calc): 129 mg/dL (calc) (ref <130)
Total CHOL/HDL Ratio: 3.5 (calc) (ref <5.0)
Triglycerides: 220 mg/dL — ABNORMAL HIGH (ref <150)

## 2022-02-26 LAB — VITAMIN B12: Vitamin B-12: 390 pg/mL (ref 200–1100)

## 2022-02-26 LAB — HEPATIC FUNCTION PANEL
AG Ratio: 1.4 (calc) (ref 1.0–2.5)
ALT: 27 U/L (ref 6–29)
AST: 22 U/L (ref 10–35)
Albumin: 4.6 g/dL (ref 3.6–5.1)
Alkaline phosphatase (APISO): 62 U/L (ref 37–153)
Bilirubin, Direct: 0.1 mg/dL (ref 0.0–0.2)
Globulin: 3.2 g/dL (calc) (ref 1.9–3.7)
Indirect Bilirubin: 0.3 mg/dL (calc) (ref 0.2–1.2)
Total Bilirubin: 0.4 mg/dL (ref 0.2–1.2)
Total Protein: 7.8 g/dL (ref 6.1–8.1)

## 2022-02-26 LAB — HEMOGLOBIN A1C
Hgb A1c MFr Bld: 5.4 % of total Hgb (ref <5.7)
Mean Plasma Glucose: 108 mg/dL
eAG (mmol/L): 6 mmol/L

## 2022-02-26 LAB — VITAMIN D 25 HYDROXY (VIT D DEFICIENCY, FRACTURES): Vit D, 25-Hydroxy: 60 ng/mL (ref 30–100)

## 2022-02-26 NOTE — Telephone Encounter (Signed)
Once received we will correct label

## 2022-02-26 NOTE — Telephone Encounter (Signed)
Copied from [place] [id number]. Topic: General - Other >> Feb 26, 2022 10:16 AM Everette C wrote: Reason for CRM: Amy May with Cytology has called to share that a recelntly submitted specifmen for the patient will be rejected due to improper labeling  Please contact further when possible

## 2022-02-28 LAB — CYTOLOGY - PAP
Comment: NEGATIVE
Diagnosis: NEGATIVE
High risk HPV: NEGATIVE

## 2022-05-01 ENCOUNTER — Other Ambulatory Visit: Payer: Self-pay | Admitting: Family Medicine

## 2022-05-01 DIAGNOSIS — M816 Localized osteoporosis [Lequesne]: Secondary | ICD-10-CM

## 2022-05-01 DIAGNOSIS — E559 Vitamin D deficiency, unspecified: Secondary | ICD-10-CM

## 2022-05-20 NOTE — Progress Notes (Unsigned)
Name: Amy May   MRN: [id number]    DOB: September 01, 1965   Date:05/21/2022       Progress Note  Subjective  Chief Complaint  Follow up   HPI  HTN: she takes medication daily, bp is at goal, no chest pain, palpitation or sob. Still losing some weight and we will continue current dose   Obesity : HTN, hyperlipidemia, prediabetes , she is down 17 lbs in the past 2 years, weight was 202 lbs in Dec 2021 , down to 193 lbs in Sep 2022 and today is down to 185 lbs . She is doing well with life style modifications   Vitamin D and B12 deficiency: resume B12 SL a few times a week, continue vitamin D supplementation   Pre-diabetes: A1C was up to 6% but went down to 5.2 % with Ozempic/ she has been off medication for months now . She has been walking at least one mile 5 days a week during her lunch break . She is still losing weight even though she is no longer taking Ozempic She is also doing intermittent fasting from 8 pm to 2 pm, drinking lots of water.   Hyperlipidemia: not on medication, reviewed levels.   The 10-year ASCVD risk score (Arnett DK, et al., 2019) is: 5.4%   Values used to calculate the score:     Age: 56 years     Sex: Female     Is Non-Hispanic African American: Yes     Diabetic: No     Tobacco smoker: No     Systolic Blood Pressure: 989 mmHg     Is BP treated: Yes     HDL Cholesterol: 52 mg/dL     Total Cholesterol: 181 mg/dL    Impingement shoulder syndrome and Knee pain: seen by Ortho, doing well, had steroid injection, using meloxicam prn only   Osteoporosis of spine: she is taking Boniva and also vitamin D otc. 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/21/2020   Plantar fasciitis, bilateral 06/19/2020   Vitamin D deficiency 06/19/2020   B12 deficiency 06/19/2020    Paresthesia of skin 06/19/2020   Benign paroxysmal positional vertigo 06/19/2020   Bunion, right 06/19/2020   Acquired trigger finger of right middle finger 01/26/2020   Low vitamin D level 07/14/2018   Abnormal TSH 07/14/2018   Paroxysmal tachycardia ([place]) 07/06/2018   Abnormal EKG 07/05/2018   Palpitations 07/05/2018   Chronic right shoulder pain 04/30/2018   Body aches 04/30/2018   Essential hypertension 07/12/2015   Obesity (BMI 30.0-34.9) 07/12/2015   Hyperlipidemia 07/12/2015   Female genuine stress incontinence 11/10/2013   Acquired cyst of kidney 11/10/2013    Past Surgical History:  Procedure Laterality Date   COLONOSCOPY WITH PROPOFOL N/A 05/25/2018   Procedure: COLONOSCOPY WITH PROPOFOL;  Surgeon: Lin Landsman, MD;  Location[facility];  Service: Gastroenterology;  Laterality: N/A;   DILATION AND CURETTAGE OF UTERUS     TUBAL LIGATION      Family History  Problem Relation Age of Onset   Heart disease Maternal Aunt    Breast cancer Neg Hx     Social History   Tobacco Use   Smoking status: Never   Smokeless tobacco: Never  Substance Use Topics   Alcohol use: No  Alcohol/week: 0.0 standard drinks of alcohol     Current Outpatient Medications:    albuterol (VENTOLIN HFA) 108 (90 Base) MCG/ACT inhaler, Inhale 2 puffs into the lungs every 6 (six) hours as needed., Disp: 18 g, Rfl: 1   ibandronate (BONIVA) 150 MG tablet, Take 1 tablet (150 mg total) by mouth every 30 (thirty) days. Take in the morning with a full glass of water, on an empty stomach, and do not take anything else by mouth or lie down for the next 30 min., Disp: 12 tablet, Rfl: 3   levocetirizine (XYZAL) 5 MG tablet, Take 1 tablet (5 mg total) by mouth daily., Disp: 90 tablet, Rfl: 1   meclizine (ANTIVERT) 25 MG tablet, Take 0.5-1 tablets (12.5-25 mg total) by mouth 3 (three) times daily as needed for dizziness., Disp: 20 tablet, Rfl: 1   meloxicam (MOBIC) 15 MG tablet, Take 1 tablet  (15 mg total) by mouth daily., Disp: 90 tablet, Rfl: 1   triamterene-hydrochlorothiazide (MAXZIDE-25) 37.5-25 MG tablet, Take 1 tablet by mouth daily., Disp: 90 tablet, Rfl: 1   Vitamin D, Ergocalciferol, (DRISDOL) 1.25 MG (50000 UNIT) CAPS capsule, TAKE 1 CAPSULE (50,000 UNITS TOTAL) BY MOUTH EVERY 7 (SEVEN) DAYS, Disp: 4 capsule, Rfl: 2  No Known Allergies  I personally reviewed active problem list, medication list, allergies, family history, social history, health maintenance with the patient/caregiver today.   ROS  Constitutional: Negative for fever or weight change.  Respiratory: Negative for cough and shortness of breath.   Cardiovascular: Negative for chest pain or palpitations.  Gastrointestinal: Negative for abdominal pain, no bowel changes.  Musculoskeletal: Negative for gait problem or joint swelling.  Skin: Negative for rash.  Neurological: Negative for dizziness or headache.  No other specific complaints in a complete review of systems (except as listed in HPI above).   Objective  Vitals:   05/21/22 0806  BP: 128/70  Pulse: 90  Resp: 16  SpO2: 98%  Weight: 185 lb (83.9 kg)  Height: '5\' 2"'$  (1.575 m)    Body mass index is 33.84 kg/m.  Physical Exam  Constitutional: Patient appears well-developed and well-nourished. Obese  No distress.  HEENT: head atraumatic, normocephalic, pupils equal and reactive to light, neck supple Cardiovascular: Normal rate, regular rhythm and normal heart sounds.  No murmur heard. No BLE edema. Pulmonary/Chest: Effort normal and breath sounds normal. No respiratory distress. Abdominal: Soft.  There is no tenderness. Psychiatric: Patient has a normal mood and affect. behavior is normal. Judgment and thought content normal.   Recent Results (from the past 2160 hour(s))  Cytology - PAP     Status: None   Collection Time: 02/25/22 12:00 AM  Result Value Ref Range   High risk HPV Negative    Adequacy      Satisfactory for evaluation;  transformation zone component PRESENT.   Diagnosis      - Negative for intraepithelial lesion or malignancy (NILM)   Comment Normal Reference Range HPV - Negative   Lipid panel     Status: Abnormal   Collection Time: 02/25/22  1:55 PM  Result Value Ref Range   Cholesterol 181 <200 mg/dL   HDL 52 > OR = 50 mg/dL   Triglycerides 220 (H) <150 mg/dL    Comment: . If a non-fasting specimen was collected, consider repeat triglyceride testing on a fasting specimen if clinically indicated.  Yates Decamp et al. J. of Clin. Lipidol. 1025;8:527-782. Marland Kitchen    LDL Cholesterol (Calc) 97 mg/dL (calc)    Comment:  Reference range: <100 . Desirable range <100 mg/dL for primary prevention;   <70 mg/dL for patients with CHD or diabetic patients  with > or = 2 CHD risk factors. Marland Kitchen LDL-C is now calculated using the Martin-Hopkins  calculation, which is a validated novel method providing  better accuracy than the Friedewald equation in the  estimation of LDL-C.  Cresenciano Genre et al. Annamaria Helling. 3500;938(18): 2061-2068  (http://education.[facility].com/faq/FAQ164)    Total CHOL/HDL Ratio 3.5 <5.0 (calc)   Non-HDL Cholesterol (Calc) 129 <130 mg/dL (calc)    Comment: For patients with diabetes plus 1 major ASCVD risk  factor, treating to a non-HDL-C goal of <100 mg/dL  (LDL-C of <70 mg/dL) is considered a therapeutic  option.   Hemoglobin A1c     Status: None   Collection Time: 02/25/22  1:55 PM  Result Value Ref Range   Hgb A1c MFr Bld 5.4 <5.7 % of total Hgb    Comment: For the purpose of screening for the presence of diabetes: . <5.7%       Consistent with the absence of diabetes 5.7-6.4%    Consistent with increased risk for diabetes             (prediabetes) > or =6.5%  Consistent with diabetes . This assay result is consistent with a decreased risk of diabetes. . Currently, no consensus exists regarding use of hemoglobin A1c for diagnosis of diabetes in children. . According to American  Diabetes Association (ADA) guidelines, hemoglobin A1c <7.0% represents optimal control in non-pregnant diabetic patients. Different metrics may apply to specific patient populations.  Standards of Medical Care in Diabetes(ADA). .    Mean Plasma Glucose 108 mg/dL   eAG (mmol/L) 6.0 mmol/L  VITAMIN D 25 Hydroxy (Vit-D Deficiency, Fractures)     Status: None   Collection Time: 02/25/22  1:55 PM  Result Value Ref Range   Vit D, 25-Hydroxy 60 30 - 100 ng/mL    Comment: Vitamin D Status         25-OH Vitamin D: . Deficiency:                    <20 ng/mL Insufficiency:             20 - 29 ng/mL Optimal:                 > or = 30 ng/mL . For 25-OH Vitamin D testing on patients on  D2-supplementation and patients for whom quantitation  of D2 and D3 fractions is required, the [facility]ureD(TM) 25-OH VIT D, (D2,D3), LC/MS/MS is recommended: order  code [id number] (patients >81yr). . See Note 1 . Note 1 . For additional information, please refer to  http://education.[facility].com/faq/FAQ199  (This link is being provided for informational/ educational purposes only.)   Vitamin B12     Status: None   Collection Time: 02/25/22  1:55 PM  Result Value Ref Range   Vitamin B-12 390 200 - 1,100 pg/mL    Comment: . Please Note: Although the reference range for vitamin B12 is [phone] pg/mL, it has been reported that between 5 and 10% of patients with values between 200 and 400 pg/mL may experience neuropsychiatric and hematologic abnormalities due to occult B12 deficiency; less than 1% of patients with values above 400 pg/mL will have symptoms. .   Hepatic function panel     Status: None   Collection Time: 02/25/22  1:55 PM  Result Value Ref Range   Total Protein 7.8 6.1 - 8.1  g/dL   Albumin 4.6 3.6 - 5.1 g/dL   Globulin 3.2 1.9 - 3.7 g/dL (calc)   AG Ratio 1.4 1.0 - 2.5 (calc)   Total Bilirubin 0.4 0.2 - 1.2 mg/dL   Bilirubin, Direct 0.1 0.0 - 0.2 mg/dL   Indirect Bilirubin 0.3  0.2 - 1.2 mg/dL (calc)   Alkaline phosphatase (APISO) 62 37 - 153 U/L   AST 22 10 - 35 U/L   ALT 27 6 - 29 U/L    PHQ2/9:    05/21/2022    8:06 AM 02/25/2022    1:20 PM 01/30/2022    7:56 AM 11/16/2021   11:54 AM 10/30/2021   10:55 AM  Depression screen PHQ 2/9  Decreased Interest 0 0 0 0 0  Down, Depressed, Hopeless 0 0 0 0 0  PHQ - 2 Score 0 0 0 0 0  Altered sleeping 0 0  0   Tired, decreased energy 0 0  0   Change in appetite 0 0  0   Feeling bad or failure about yourself  0 0  0   Trouble concentrating 0 0  0   Moving slowly or fidgety/restless 0 0  0   Suicidal thoughts 0 0  0   PHQ-9 Score 0 0  0     phq 9 is negative   Fall Risk:    05/21/2022    8:06 AM 02/25/2022    1:20 PM 01/30/2022    7:56 AM 11/16/2021   11:54 AM 10/30/2021   10:54 AM  Fall Risk   Falls in the past year? 0 0 0 0 0  Number falls in past yr: 0 0 0 0 0  Injury with Fall? 0 0 0 0 0  Risk for fall due to : No Fall Risks No Fall Risks No Fall Risks No Fall Risks   Follow up Falls prevention discussed Falls prevention discussed Falls prevention discussed Falls prevention discussed Falls evaluation completed      Functional Status Survey: Is the patient deaf or have difficulty hearing?: No Does the patient have difficulty seeing, even when wearing glasses/contacts?: No Does the patient have difficulty concentrating, remembering, or making decisions?: No Does the patient have difficulty walking or climbing stairs?: No Does the patient have difficulty dressing or bathing?: No Does the patient have difficulty doing errands alone such as visiting a doctor's office or shopping?: No    Assessment & Plan  1. Essential hypertension  - triamterene-hydrochlorothiazide (MAXZIDE-25) 37.5-25 MG tablet; Take 1 tablet by mouth daily.  Dispense: 90 tablet; Refill: 1  2. Prediabetes  Continue life style modification  3. B12 deficiency  Resume supplementation  4. Localized osteoporosis without current  pathological fracture  Taking boniva  5. Vitamin D deficiency  Continue vitamin D supplementation   6. Impingement syndrome of shoulder, left  - meloxicam (MOBIC) 15 MG tablet; Take 1 tablet (15 mg total) by mouth daily.  Dispense: 90 tablet; Refill: 0  7. Seasonal allergic rhinitis, unspecified trigger  - levocetirizine (XYZAL) 5 MG tablet; Take 1 tablet (5 mg total) by mouth daily.  Dispense: 90 tablet; Refill: 1

## 2022-05-21 ENCOUNTER — Ambulatory Visit (INDEPENDENT_AMBULATORY_CARE_PROVIDER_SITE_OTHER): Payer: Managed Care, Other (non HMO) | Admitting: Family Medicine

## 2022-05-21 ENCOUNTER — Encounter: Payer: Self-pay | Admitting: Family Medicine

## 2022-05-21 VITALS — BP 128/70 | HR 90 | Resp 16 | Ht 62.0 in | Wt 185.0 lb

## 2022-05-21 DIAGNOSIS — M816 Localized osteoporosis [Lequesne]: Secondary | ICD-10-CM | POA: Diagnosis not present

## 2022-05-21 DIAGNOSIS — E538 Deficiency of other specified B group vitamins: Secondary | ICD-10-CM | POA: Diagnosis not present

## 2022-05-21 DIAGNOSIS — I10 Essential (primary) hypertension: Secondary | ICD-10-CM | POA: Diagnosis not present

## 2022-05-21 DIAGNOSIS — R7303 Prediabetes: Secondary | ICD-10-CM

## 2022-05-21 DIAGNOSIS — Z23 Encounter for immunization: Secondary | ICD-10-CM

## 2022-05-21 DIAGNOSIS — J302 Other seasonal allergic rhinitis: Secondary | ICD-10-CM

## 2022-05-21 DIAGNOSIS — M7542 Impingement syndrome of left shoulder: Secondary | ICD-10-CM

## 2022-05-21 DIAGNOSIS — E559 Vitamin D deficiency, unspecified: Secondary | ICD-10-CM

## 2022-05-21 MED ORDER — TRIAMTERENE-HCTZ 37.5-25 MG PO TABS: 1.0000 | ORAL_TABLET | Freq: Every day | ORAL | 1 refills | Status: DC

## 2022-05-21 MED ORDER — LEVOCETIRIZINE DIHYDROCHLORIDE 5 MG PO TABS: 5.0000 mg | ORAL_TABLET | Freq: Every day | ORAL | 1 refills | Status: DC

## 2022-05-21 MED ORDER — MELOXICAM 15 MG PO TABS: 15.0000 mg | ORAL_TABLET | Freq: Every day | ORAL | 0 refills | Status: DC

## 2022-05-21 NOTE — Patient Instructions (Signed)
Sublingual B12 1000 mcg three times a week

## 2022-07-12 ENCOUNTER — Other Ambulatory Visit: Payer: Self-pay

## 2022-07-12 ENCOUNTER — Telehealth (INDEPENDENT_AMBULATORY_CARE_PROVIDER_SITE_OTHER): Payer: Managed Care, Other (non HMO) | Admitting: Nurse Practitioner

## 2022-07-12 ENCOUNTER — Encounter: Payer: Self-pay | Admitting: Nurse Practitioner

## 2022-07-12 DIAGNOSIS — J0140 Acute pansinusitis, unspecified: Secondary | ICD-10-CM

## 2022-07-12 MED ORDER — AMOXICILLIN-POT CLAVULANATE 875-125 MG PO TABS: 1.0000 | ORAL_TABLET | Freq: Two times a day (BID) | ORAL | 0 refills | Status: DC

## 2022-07-12 NOTE — Progress Notes (Signed)
Name: Amy May   MRN: [id number]    DOB: January 10, 1966   Date:07/12/2022       Progress Note  Subjective  Chief Complaint  Chief Complaint  Patient presents with   Sinusitis    Cough, congested, left ear pain for 7 days, has taken 3 covid test all negative.     I connected with  [name]  on 07/12/22 at 10:50 am by a video enabled telemedicine application and verified that I am speaking with the correct person using two identifiers.  I discussed the limitations of evaluation and management by telemedicine and the availability of in person appointments. The patient expressed understanding and agreed to proceed with a virtual visit  Staff also discussed with the patient that there may be a patient responsible charge related to this service. Patient Location: home Provider Location: [facility] Additional Individuals present: alone  HPI  Sinus infection: she reports symptoms started a week ago.  She has taken three covid tests which were negative. Symptoms include nasal congestion, left ear pain, facial pressure and cough because of drainage. She denies any shortness of breath or fever.   She says she is taking her xyzal and flonase.  Patient reports that she usually gets antibiotics when this happens. She says it happens probably every year this time. Discussed that her symptoms have only been for a week and I recommend giving it a few more days. Will give prescription for Augmentin, can start if no improvement.   Patient Active Problem List   Diagnosis Date Noted   Impingement syndrome of shoulder, left 05/21/2022   Seasonal allergic rhinitis 05/21/2022   Prediabetes 11/16/2021   Localized osteoporosis without current pathological fracture 09/21/2020   Plantar fasciitis, bilateral 06/19/2020   Vitamin D deficiency 06/19/2020   B12 deficiency 06/19/2020   Paresthesia of skin 06/19/2020   Benign paroxysmal positional vertigo 06/19/2020   Bunion, right 06/19/2020   Acquired  trigger finger of right middle finger 01/26/2020   Low vitamin D level 07/14/2018   Abnormal TSH 07/14/2018   Paroxysmal tachycardia ([place]) 07/06/2018   Abnormal EKG 07/05/2018   Palpitations 07/05/2018   Chronic right shoulder pain 04/30/2018   Body aches 04/30/2018   Essential hypertension 07/12/2015   Obesity (BMI 30.0-34.9) 07/12/2015   Hyperlipidemia 07/12/2015   Female genuine stress incontinence 11/10/2013   Acquired cyst of kidney 11/10/2013    Social History   Tobacco Use   Smoking status: Never   Smokeless tobacco: Never  Substance Use Topics   Alcohol use: No    Alcohol/week: 0.0 standard drinks of alcohol     Current Outpatient Medications:    ibandronate (BONIVA) 150 MG tablet, Take 1 tablet (150 mg total) by mouth every 30 (thirty) days. Take in the morning with a full glass of water, on an empty stomach, and do not take anything else by mouth or lie down for the next 30 min., Disp: 12 tablet, Rfl: 3   levocetirizine (XYZAL) 5 MG tablet, Take 1 tablet (5 mg total) by mouth daily., Disp: 90 tablet, Rfl: 1   meclizine (ANTIVERT) 25 MG tablet, Take 0.5-1 tablets (12.5-25 mg total) by mouth 3 (three) times daily as needed for dizziness., Disp: 20 tablet, Rfl: 1   meloxicam (MOBIC) 15 MG tablet, Take 1 tablet (15 mg total) by mouth daily., Disp: 90 tablet, Rfl: 0   triamterene-hydrochlorothiazide (MAXZIDE-25) 37.5-25 MG tablet, Take 1 tablet by mouth daily., Disp: 90 tablet, Rfl: 1   Vitamin D,  Ergocalciferol, (DRISDOL) 1.25 MG (50000 UNIT) CAPS capsule, TAKE 1 CAPSULE (50,000 UNITS TOTAL) BY MOUTH EVERY 7 (SEVEN) DAYS, Disp: 4 capsule, Rfl: 2  No Known Allergies  I personally reviewed active problem list, medication list, allergies, notes from last encounter with the patient/caregiver today.  ROS  Constitutional: Negative for fever or weight change. HEENT: positive for nasal congestion, facial pressure and left ear pain  Respiratory: positive for cough and negative  for  shortness of breath.   Cardiovascular: Negative for chest pain or palpitations.  Gastrointestinal: Negative for abdominal pain, no bowel changes.  Musculoskeletal: Negative for gait problem or joint swelling.  Skin: Negative for rash.  Neurological: Negative for dizziness or headache.  No other specific complaints in a complete review of systems (except as listed in HPI above).   Objective  Virtual encounter, vitals not obtained.  There is no height or weight on file to calculate BMI.  Nursing Note and Vital Signs reviewed.  Physical Exam  Awake,alert and oriented x 3 speaking in complete sentences  No results found for this or any previous visit (from the past 72 hour(s)).  Assessment & Plan  1. Acute non-recurrent pansinusitis Continue flonase and xyzal, push fluids, can take mucinex If not improvement in a few days can start augmentin - amoxicillin-clavulanate (AUGMENTIN) 875-125 MG tablet; Take 1 tablet by mouth 2 (two) times daily for 10 days.  Dispense: 20 tablet; Refill: 0   -Red flags and when to present for emergency care or RTC including fever >101.68F, chest pain, shortness of breath, new/worsening/un-resolving symptoms,  reviewed with patient at time of visit. Follow up and care instructions discussed and provided in AVS. - I discussed the assessment and treatment plan with the patient. The patient was provided an opportunity to ask questions and all were answered. The patient agreed with the plan and demonstrated an understanding of the instructions.  I provided 15 minutes of non-face-to-face time during this encounter.  Bo Merino, FNP

## 2022-07-18 NOTE — Progress Notes (Signed)
Name: Amy May   MRN: [id number]    DOB: 05-26-1966   Date:07/19/2022       Progress Note  Subjective  Chief Complaint  Hand Pain  HPI  Patient has a remote history of gout however on January first she woke up with pain on right middle finger and one day later it was very swollen and little red but only tender when pushing on PIP area, not tender to light touch. She had been off work and denies overusing or trauma. The swelling lasted a couple of days but is still sore today. This morning she also noticed pain on PIP of left middle finger but not swelling or redness. She denies family history of lupus or RA. She has some meloxicam at home but did not take anything for pain and or inflammation   Patient Active Problem List   Diagnosis Date Noted   Impingement syndrome of shoulder, left 05/21/2022   Seasonal allergic rhinitis 05/21/2022   Prediabetes 11/16/2021   Localized osteoporosis without current pathological fracture 09/21/2020   Plantar fasciitis, bilateral 06/19/2020   Vitamin D deficiency 06/19/2020   B12 deficiency 06/19/2020   Paresthesia of skin 06/19/2020   Benign paroxysmal positional vertigo 06/19/2020   Bunion, right 06/19/2020   Acquired trigger finger of right middle finger 01/26/2020   Low vitamin D level 07/14/2018   Abnormal TSH 07/14/2018   Paroxysmal tachycardia ([place]) 07/06/2018   Abnormal EKG 07/05/2018   Palpitations 07/05/2018   Chronic right shoulder pain 04/30/2018   Body aches 04/30/2018   Essential hypertension 07/12/2015   Obesity (BMI 30.0-34.9) 07/12/2015   Hyperlipidemia 07/12/2015   Female genuine stress incontinence 11/10/2013   Acquired cyst of kidney 11/10/2013    Past Surgical History:  Procedure Laterality Date   COLONOSCOPY WITH PROPOFOL N/A 05/25/2018   Procedure: COLONOSCOPY WITH PROPOFOL;  Surgeon: Lin Landsman, MD;  Location: [facility] ENDOSCOPY;  Service: Gastroenterology;  Laterality: N/A;   DILATION AND CURETTAGE OF  UTERUS     TUBAL LIGATION      Family History  Problem Relation Age of Onset   Heart disease Maternal Aunt    Breast cancer Neg Hx     Social History   Tobacco Use   Smoking status: Never   Smokeless tobacco: Never  Substance Use Topics   Alcohol use: No    Alcohol/week: 0.0 standard drinks of alcohol     Current Outpatient Medications:    amoxicillin-clavulanate (AUGMENTIN) 875-125 MG tablet, Take 1 tablet by mouth 2 (two) times daily for 10 days., Disp: 20 tablet, Rfl: 0   ibandronate (BONIVA) 150 MG tablet, Take 1 tablet (150 mg total) by mouth every 30 (thirty) days. Take in the morning with a full glass of water, on an empty stomach, and do not take anything else by mouth or lie down for the next 30 min., Disp: 12 tablet, Rfl: 3   levocetirizine (XYZAL) 5 MG tablet, Take 1 tablet (5 mg total) by mouth daily., Disp: 90 tablet, Rfl: 1   meclizine (ANTIVERT) 25 MG tablet, Take 0.5-1 tablets (12.5-25 mg total) by mouth 3 (three) times daily as needed for dizziness., Disp: 20 tablet, Rfl: 1   meloxicam (MOBIC) 15 MG tablet, Take 1 tablet (15 mg total) by mouth daily., Disp: 90 tablet, Rfl: 0   triamterene-hydrochlorothiazide (MAXZIDE-25) 37.5-25 MG tablet, Take 1 tablet by mouth daily., Disp: 90 tablet, Rfl: 1   Vitamin D, Ergocalciferol, (DRISDOL) 1.25 MG (50000 UNIT) CAPS capsule, TAKE 1 CAPSULE (50,000  UNITS TOTAL) BY MOUTH EVERY 7 (SEVEN) DAYS, Disp: 4 capsule, Rfl: 2  No Known Allergies  I personally reviewed active problem list, medication list, allergies, family history, social history, health maintenance with the patient/caregiver today.   ROS  Ten systems reviewed and is negative except as mentioned in HPI   Objective  Vitals:   07/19/22 1409  BP: 116/68  Pulse: 98  Resp: 16  SpO2: 99%  Weight: 190 lb (86.2 kg)  Height: '5\' 2"'$  (1.575 m)    Body mass index is 34.75 kg/m.  Physical Exam  Constitutional: Patient appears well-developed and well-nourished.  Obese  No distress.  HEENT: head atraumatic, normocephalic, pupils equal and reactive to light, neck supple Cardiovascular: Normal rate, regular rhythm and normal heart sounds.  No murmur heard. No BLE edema. Pulmonary/Chest: Effort normal and breath sounds normal. No respiratory distress. Abdominal: Soft.  There is no tenderness. Muscular skeletal: tender during palpation of PIP right middle finger and some of left middle finger, no redness or effusion but pain with flexion of both fingers also noticed Psychiatric: Patient has a normal mood and affect. behavior is normal. Judgment and thought content normal.   PHQ2/9:    07/19/2022    2:08 PM 07/12/2022   10:53 AM 05/21/2022    8:06 AM 02/25/2022    1:20 PM 01/30/2022    7:56 AM  Depression screen PHQ 2/9  Decreased Interest 0 0 0 0 0  Down, Depressed, Hopeless 0 0 0 0 0  PHQ - 2 Score 0 0 0 0 0  Altered sleeping 0  0 0   Tired, decreased energy 0  0 0   Change in appetite 0  0 0   Feeling bad or failure about yourself  0  0 0   Trouble concentrating 0  0 0   Moving slowly or fidgety/restless 0  0 0   Suicidal thoughts 0  0 0   PHQ-9 Score 0  0 0     phq 9 is negative   Fall Risk:    07/19/2022    2:08 PM 07/12/2022   10:53 AM 05/21/2022    8:06 AM 02/25/2022    1:20 PM 01/30/2022    7:56 AM  Fall Risk   Falls in the past year? 0 0 0 0 0  Number falls in past yr: 0 0 0 0 0  Injury with Fall? 0 0 0 0 0  Risk for fall due to : No Fall Risks  No Fall Risks No Fall Risks No Fall Risks  Follow up Falls prevention discussed Falls evaluation completed Falls prevention discussed Falls prevention discussed Falls prevention discussed      Functional Status Survey: Is the patient deaf or have difficulty hearing?: No Does the patient have difficulty seeing, even when wearing glasses/contacts?: No Does the patient have difficulty concentrating, remembering, or making decisions?: No Does the patient have difficulty walking or climbing  stairs?: No Does the patient have difficulty dressing or bathing?: No Does the patient have difficulty doing errands alone such as visiting a doctor's office or shopping?: No    Assessment & Plan  1. Pain in finger of both hands  - Uric acid - ANA,IFA RA Diag Pnl w/rflx Tit/Patn - Sedimentation rate - C-reactive protein  2. Effusion of joint of right hand  - Uric acid - ANA,IFA RA Diag Pnl w/rflx Tit/Patn - Sedimentation rate - C-reactive protein  3. Need for Tdap vaccination  - Tdap vaccine greater than  or equal to 7yo IM

## 2022-07-19 ENCOUNTER — Encounter: Payer: Self-pay | Admitting: Family Medicine

## 2022-07-19 ENCOUNTER — Ambulatory Visit (INDEPENDENT_AMBULATORY_CARE_PROVIDER_SITE_OTHER): Payer: Managed Care, Other (non HMO) | Admitting: Family Medicine

## 2022-07-19 VITALS — BP 116/68 | HR 98 | Resp 16 | Ht 62.0 in | Wt 190.0 lb

## 2022-07-19 DIAGNOSIS — Z23 Encounter for immunization: Secondary | ICD-10-CM

## 2022-07-19 DIAGNOSIS — M25441 Effusion, right hand: Secondary | ICD-10-CM

## 2022-07-19 DIAGNOSIS — M79645 Pain in left finger(s): Secondary | ICD-10-CM

## 2022-07-19 DIAGNOSIS — M79644 Pain in right finger(s): Secondary | ICD-10-CM | POA: Diagnosis not present

## 2022-07-22 ENCOUNTER — Other Ambulatory Visit: Payer: Self-pay | Admitting: Family Medicine

## 2022-07-22 DIAGNOSIS — M1000 Idiopathic gout, unspecified site: Secondary | ICD-10-CM

## 2022-07-22 LAB — SEDIMENTATION RATE: Sed Rate: 22 mm/h (ref 0–30)

## 2022-07-22 LAB — ANA,IFA RA DIAG PNL W/RFLX TIT/PATN
Anti Nuclear Antibody (ANA): NEGATIVE
Cyclic Citrullin Peptide Ab: <16 UNITS
Rheumatoid fact SerPl-aCnc: <14 IU/mL (ref <14)

## 2022-07-22 LAB — URIC ACID: Uric Acid, Serum: 7.5 mg/dL — ABNORMAL HIGH (ref 2.5–7.0)

## 2022-07-22 LAB — C-REACTIVE PROTEIN: CRP: 5 mg/L (ref <8.0)

## 2022-07-22 MED ORDER — ALLOPURINOL 100 MG PO TABS: 100.0000 mg | ORAL_TABLET | Freq: Every day | ORAL | 0 refills | Status: DC

## 2022-07-22 MED ORDER — COLCHICINE 0.6 MG PO TABS: 1.2000 mg | ORAL_TABLET | Freq: Every day | ORAL | 0 refills | Status: DC

## 2022-07-31 ENCOUNTER — Encounter: Payer: Self-pay | Admitting: Nurse Practitioner

## 2022-07-31 ENCOUNTER — Other Ambulatory Visit: Payer: Self-pay

## 2022-07-31 ENCOUNTER — Telehealth (INDEPENDENT_AMBULATORY_CARE_PROVIDER_SITE_OTHER): Payer: Managed Care, Other (non HMO) | Admitting: Nurse Practitioner

## 2022-07-31 DIAGNOSIS — U071 COVID-19: Secondary | ICD-10-CM

## 2022-07-31 MED ORDER — PROMETHAZINE-DM 6.25-15 MG/5ML PO SYRP: 5.0000 mL | ORAL_SOLUTION | Freq: Four times a day (QID) | ORAL | 0 refills | Status: DC | PRN

## 2022-07-31 MED ORDER — NIRMATRELVIR/RITONAVIR (PAXLOVID)TABLET: 3.0000 | ORAL_TABLET | Freq: Two times a day (BID) | ORAL | 0 refills | Status: AC

## 2022-07-31 NOTE — Progress Notes (Signed)
Name: Amy May   MRN: [id number]    DOB: Aug 23, 1965   Date:07/31/2022       Progress Note  Subjective  Chief Complaint  Chief Complaint  Patient presents with   Covid Positive    Sneezing, cough, congestion, nose bleeds for 3 days    I connected with  [name]  on 07/31/22 at 11:20 AM EST by a video enabled telemedicine application and verified that I am speaking with the correct person using two identifiers.  I discussed the limitations of evaluation and management by telemedicine and the availability of in person appointments. The patient expressed understanding and agreed to proceed with a virtual visit  Staff also discussed with the patient that there may be a patient responsible charge related to this service. Patient Location: home Provider Location: [facility] Additional Individuals present: alone  HPI  Covid:  patient reports symptoms started three days ago. Symptoms include sneezing, coughing, congestion, nose bleeds. She denies any fever or shortness of breath. She has been taking OTC allergy medication.  She reports she tested positive for covid this morning at work.  Discussed covid antiviral treatment, discussed side effects and administration.  Discussed cdc guidelines of quarantine.  Discussed reasons to seek emergency care. Recommend taking zyrtec, flonase, mucinex, vitamin d, vitamin c, and zinc. Push fluids and get rest.   She says tessalon perls do not work for her. Sent in some phenergan DM.    Patient Active Problem List   Diagnosis Date Noted   Impingement syndrome of shoulder, left 05/21/2022   Seasonal allergic rhinitis 05/21/2022   Prediabetes 11/16/2021   Localized osteoporosis without current pathological fracture 09/21/2020   Plantar fasciitis, bilateral 06/19/2020   Vitamin D deficiency 06/19/2020   B12 deficiency 06/19/2020   Paresthesia of skin 06/19/2020   Benign paroxysmal positional vertigo 06/19/2020   Bunion, right 06/19/2020   Acquired  trigger finger of right middle finger 01/26/2020   Low vitamin D level 07/14/2018   Abnormal TSH 07/14/2018   Paroxysmal tachycardia ([place]) 07/06/2018   Abnormal EKG 07/05/2018   Palpitations 07/05/2018   Chronic right shoulder pain 04/30/2018   Body aches 04/30/2018   Essential hypertension 07/12/2015   Obesity (BMI 30.0-34.9) 07/12/2015   Hyperlipidemia 07/12/2015   Female genuine stress incontinence 11/10/2013   Acquired cyst of kidney 11/10/2013    Social History   Tobacco Use   Smoking status: Never   Smokeless tobacco: Never  Substance Use Topics   Alcohol use: No    Alcohol/week: 0.0 standard drinks of alcohol     Current Outpatient Medications:    allopurinol (ZYLOPRIM) 100 MG tablet, Take 1 tablet (100 mg total) by mouth daily., Disp: 90 tablet, Rfl: 0   colchicine 0.6 MG tablet, Take 2 tablets (1.2 mg total) by mouth daily. Once and one a day for 3 days after that prn only, Disp: 30 tablet, Rfl: 0   ibandronate (BONIVA) 150 MG tablet, Take 1 tablet (150 mg total) by mouth every 30 (thirty) days. Take in the morning with a full glass of water, on an empty stomach, and do not take anything else by mouth or lie down for the next 30 min., Disp: 12 tablet, Rfl: 3   levocetirizine (XYZAL) 5 MG tablet, Take 1 tablet (5 mg total) by mouth daily., Disp: 90 tablet, Rfl: 1   meclizine (ANTIVERT) 25 MG tablet, Take 0.5-1 tablets (12.5-25 mg total) by mouth 3 (three) times daily as needed for dizziness., Disp: 20 tablet,  Rfl: 1   meloxicam (MOBIC) 15 MG tablet, Take 1 tablet (15 mg total) by mouth daily., Disp: 90 tablet, Rfl: 0   triamterene-hydrochlorothiazide (MAXZIDE-25) 37.5-25 MG tablet, Take 1 tablet by mouth daily., Disp: 90 tablet, Rfl: 1   Vitamin D, Ergocalciferol, (DRISDOL) 1.25 MG (50000 UNIT) CAPS capsule, TAKE 1 CAPSULE (50,000 UNITS TOTAL) BY MOUTH EVERY 7 (SEVEN) DAYS, Disp: 4 capsule, Rfl: 2  No Known Allergies  I personally reviewed active problem list, medication  list, allergies with the patient/caregiver today.  ROS  Constitutional: Negative for fever or weight change.  HEENT: positive for nasal congestion, sneezing Respiratory: positive for cough and negative shortness of breath.   Cardiovascular: Negative for chest pain or palpitations.  Gastrointestinal: Negative for abdominal pain, no bowel changes.  Musculoskeletal: Negative for gait problem or joint swelling.  Skin: Negative for rash.  Neurological: Negative for dizziness, positive for  headache.  No other specific complaints in a complete review of systems (except as listed in HPI above).   Objective  Virtual encounter, vitals not obtained.  There is no height or weight on file to calculate BMI.  Nursing Note and Vital Signs reviewed.  Physical Exam  Awake, alert and oriented, speaking in complete sentences  No results found for this or any previous visit (from the past 72 hour(s)).  Assessment & Plan  1. COVID-19 Recommend taking zyrtec, flonase, mucinex, vitamin d, vitamin c, and zinc. Push fluids and get rest.    Discussed covid antiviral treatment, discussed side effects and administration.  Discussed cdc guidelines of quarantine.  Discussed reasons to seek emergency care.   - nirmatrelvir/ritonavir (PAXLOVID) 20 x 150 MG & 10 x '100MG'$  TABS; Take 3 tablets by mouth 2 (two) times daily for 5 days. (Take nirmatrelvir 150 mg two tablets twice daily for 5 days and ritonavir 100 mg one tablet twice daily for 5 days) Patient GFR is 63  Dispense: 30 tablet; Refill: 0 - promethazine-dextromethorphan (PROMETHAZINE-DM) 6.25-15 MG/5ML syrup; Take 5 mLs by mouth 4 (four) times daily as needed for cough.  Dispense: 118 mL; Refill: 0   -Red flags and when to present for emergency care or RTC including fever >101.65F, chest pain, shortness of breath, new/worsening/un-resolving symptoms,  reviewed with patient at time of visit. Follow up and care instructions discussed and provided in AVS. - I  discussed the assessment and treatment plan with the patient. The patient was provided an opportunity to ask questions and all were answered. The patient agreed with the plan and demonstrated an understanding of the instructions.  I provided 15 minutes of non-face-to-face time during this encounter.  Bo Merino, FNP

## 2022-08-04 ENCOUNTER — Other Ambulatory Visit: Payer: Self-pay | Admitting: Family Medicine

## 2022-08-04 DIAGNOSIS — E559 Vitamin D deficiency, unspecified: Secondary | ICD-10-CM

## 2022-08-04 DIAGNOSIS — M816 Localized osteoporosis [Lequesne]: Secondary | ICD-10-CM

## 2022-08-05 ENCOUNTER — Telehealth: Payer: Managed Care, Other (non HMO) | Admitting: Family Medicine

## 2022-08-05 ENCOUNTER — Encounter: Payer: Self-pay | Admitting: Family Medicine

## 2022-08-05 DIAGNOSIS — U071 COVID-19: Secondary | ICD-10-CM

## 2022-08-05 DIAGNOSIS — R490 Dysphonia: Secondary | ICD-10-CM | POA: Diagnosis not present

## 2022-08-05 MED ORDER — PREDNISONE 10 MG PO TABS: 10.0000 mg | ORAL_TABLET | Freq: Three times a day (TID) | ORAL | 0 refills | Status: DC

## 2022-08-05 NOTE — Progress Notes (Signed)
Name: Amy May   MRN: [id number]    DOB: 1966/03/28   Date:08/05/2022       Progress Note  Subjective  Chief Complaint  Chief Complaint  Patient presents with   Hoarse   Sinus Drainage    I connected with  [name]  on 08/05/22 at  7:40 AM EST by a video enabled telemedicine application and verified that I am speaking with the correct person using two identifiers.  I discussed the limitations of evaluation and management by telemedicine and the availability of in person appointments. The patient expressed understanding and agreed to proceed with the virtual visit  Staff also discussed with the patient that there may be a patient responsible charge related to this service. Patient Location: at work  Provider Location: [facility] Additional Individuals present: alone  HPI  COVID-19: she developed sneezing and itchy nose on Tuesday and was tested for COVID-19 and was positive , she never had chills, fever, nausea or vomiting. She states only symptom is hoarseness .She states straining her voice causes some pain at night. She denies fatigue or change in appetite   Patient Active Problem List   Diagnosis Date Noted   Impingement syndrome of shoulder, left 05/21/2022   Seasonal allergic rhinitis 05/21/2022   Prediabetes 11/16/2021   Localized osteoporosis without current pathological fracture 09/21/2020   Plantar fasciitis, bilateral 06/19/2020   Vitamin D deficiency 06/19/2020   B12 deficiency 06/19/2020   Paresthesia of skin 06/19/2020   Benign paroxysmal positional vertigo 06/19/2020   Bunion, right 06/19/2020   Acquired trigger finger of right middle finger 01/26/2020   Low vitamin D level 07/14/2018   Abnormal TSH 07/14/2018   Paroxysmal tachycardia ([place]) 07/06/2018   Abnormal EKG 07/05/2018   Palpitations 07/05/2018   Chronic right shoulder pain 04/30/2018   Body aches 04/30/2018   Essential hypertension 07/12/2015   Obesity (BMI 30.0-34.9) 07/12/2015    Hyperlipidemia 07/12/2015   Female genuine stress incontinence 11/10/2013   Acquired cyst of kidney 11/10/2013    Past Surgical History:  Procedure Laterality Date   COLONOSCOPY WITH PROPOFOL N/A 05/25/2018   Procedure: COLONOSCOPY WITH PROPOFOL;  Surgeon: Lin Landsman, MD;  Location: [facility] ENDOSCOPY;  Service: Gastroenterology;  Laterality: N/A;   DILATION AND CURETTAGE OF UTERUS     TUBAL LIGATION      Family History  Problem Relation Age of Onset   Heart disease Maternal Aunt    Breast cancer Neg Hx     Social History   Socioeconomic History   Marital status: Divorced    Spouse name: Not on file   Number of children: 2   Years of education: Not on file   Highest education level: Not on file  Occupational History   Occupation: receptionist    Employer: [employer]  Tobacco Use   Smoking status: Never   Smokeless tobacco: Never  Vaping Use   Vaping Use: Never used  Substance and Sexual Activity   Alcohol use: No    Alcohol/week: 0.0 standard drinks of alcohol   Drug use: No   Sexual activity: Not Currently    Partners: Male  Other Topics Concern   Not on file  Social History Narrative   Not on file   Social Determinants of Health   Financial Resource Strain: Low Risk  (02/25/2022)   Overall Financial Resource Strain (CARDIA)    Difficulty of Paying Living Expenses: Not hard at all  Food Insecurity: No Food Insecurity (02/25/2022)  Hunger Vital Sign    Worried About Running Out of Food in the Last Year: Never true    Ran Out of Food in the Last Year: Never true  Transportation Needs: No Transportation Needs (02/25/2022)   PRAPARE - Hydrologist (Medical): No    Lack of Transportation (Non-Medical): No  Physical Activity: Sufficiently Active (02/25/2022)   Exercise Vital Sign    Days of Exercise per Week: 7 days    Minutes of Exercise per Session: 30 min  Stress: No Stress Concern Present (02/25/2022)   [place]    Feeling of Stress : Not at all  Social Connections: Moderately Integrated (02/25/2022)   Social Connection and Isolation Panel [NHANES]    Frequency of Communication with Friends and Family: More than three times a week    Frequency of Social Gatherings with Friends and Family: More than three times a week    Attends Religious Services: More than 4 times per year    Active Member of [employer] or Organizations: Yes    Attends Music therapist: More than 4 times per year    Marital Status: Divorced  Intimate Partner Violence: Not At Risk (02/25/2022)   Humiliation, Afraid, Rape, and Kick questionnaire    Fear of Current or Ex-Partner: No    Emotionally Abused: No    Physically Abused: No    Sexually Abused: No     Current Outpatient Medications:    allopurinol (ZYLOPRIM) 100 MG tablet, Take 1 tablet (100 mg total) by mouth daily., Disp: 90 tablet, Rfl: 0   colchicine 0.6 MG tablet, Take 2 tablets (1.2 mg total) by mouth daily. Once and one a day for 3 days after that prn only, Disp: 30 tablet, Rfl: 0   ibandronate (BONIVA) 150 MG tablet, Take 1 tablet (150 mg total) by mouth every 30 (thirty) days. Take in the morning with a full glass of water, on an empty stomach, and do not take anything else by mouth or lie down for the next 30 min., Disp: 12 tablet, Rfl: 3   levocetirizine (XYZAL) 5 MG tablet, Take 1 tablet (5 mg total) by mouth daily., Disp: 90 tablet, Rfl: 1   meclizine (ANTIVERT) 25 MG tablet, Take 0.5-1 tablets (12.5-25 mg total) by mouth 3 (three) times daily as needed for dizziness., Disp: 20 tablet, Rfl: 1   meloxicam (MOBIC) 15 MG tablet, Take 1 tablet (15 mg total) by mouth daily., Disp: 90 tablet, Rfl: 0   nirmatrelvir/ritonavir (PAXLOVID) 20 x 150 MG & 10 x '100MG'$  TABS, Take 3 tablets by mouth 2 (two) times daily for 5 days. (Take nirmatrelvir 150 mg two tablets twice daily for 5 days and ritonavir  100 mg one tablet twice daily for 5 days) Patient GFR is 63, Disp: 30 tablet, Rfl: 0   promethazine-dextromethorphan (PROMETHAZINE-DM) 6.25-15 MG/5ML syrup, Take 5 mLs by mouth 4 (four) times daily as needed for cough., Disp: 118 mL, Rfl: 0   triamterene-hydrochlorothiazide (MAXZIDE-25) 37.5-25 MG tablet, Take 1 tablet by mouth daily., Disp: 90 tablet, Rfl: 1   Vitamin D, Ergocalciferol, (DRISDOL) 1.25 MG (50000 UNIT) CAPS capsule, TAKE 1 CAPSULE (50,000 UNITS TOTAL) BY MOUTH EVERY 7 (SEVEN) DAYS, Disp: 4 capsule, Rfl: 2  No Known Allergies  I personally reviewed active problem list, medication list, allergies, family history, social history, health maintenance with the patient/caregiver today.   ROS  Ten systems reviewed and is negative except  as mentioned in HPI   Objective  Virtual encounter, vitals not obtained.  There is no height or weight on file to calculate BMI.  Physical Exam  Awake, alert and oriented, sounds hoarse   No results found for this or any previous visit (from the past 72 hour(s)).  PHQ2/9:    08/05/2022    7:37 AM 07/31/2022   10:42 AM 07/19/2022    2:08 PM 07/12/2022   10:53 AM 05/21/2022    8:06 AM  Depression screen PHQ 2/9  Decreased Interest 0 0 0 0 0  Down, Depressed, Hopeless 0 0 0 0 0  PHQ - 2 Score 0 0 0 0 0  Altered sleeping 0  0  0  Tired, decreased energy 0  0  0  Change in appetite 0  0  0  Feeling bad or failure about yourself  0  0  0  Trouble concentrating 0  0  0  Moving slowly or fidgety/restless 0  0  0  Suicidal thoughts 0  0  0  PHQ-9 Score 0  0  0   PHQ-2/9 Result is negative.    Fall Risk:    08/05/2022    7:37 AM 07/31/2022   10:42 AM 07/19/2022    2:08 PM 07/12/2022   10:53 AM 05/21/2022    8:06 AM  Fall Risk   Falls in the past year? 0 0 0 0 0  Number falls in past yr: 0 0 0 0 0  Injury with Fall? 0 0 0 0 0  Risk for fall due to : No Fall Risks  No Fall Risks  No Fall Risks  Follow up Falls prevention discussed Falls  evaluation completed Falls prevention discussed Falls evaluation completed Falls prevention discussed      Assessment & Plan  1. COVID-19   2. Hoarseness of voice  - predniSONE (DELTASONE) 10 MG tablet; Take 1 tablet (10 mg total) by mouth 3 (three) times daily with meals.  Dispense: 15 tablet; Refill: 0  Explained to take with food and not before bedtime   I discussed the assessment and treatment plan with the patient. The patient was provided an opportunity to ask questions and all were answered. The patient agreed with the plan and demonstrated an understanding of the instructions.  The patient was advised to call back or seek an in-person evaluation if the symptoms worsen or if the condition fails to improve as anticipated.  I provided 15  minutes of non-face-to-face time during this encounter.

## 2022-08-12 ENCOUNTER — Telehealth: Payer: Self-pay | Admitting: Emergency Medicine

## 2022-08-12 NOTE — Telephone Encounter (Signed)
Patient notified to make appointment

## 2022-08-12 NOTE — Telephone Encounter (Signed)
Sore throat for 3 weeks. The pain feels like its her throat by her ear. Pain on left side. Hard to swollen. Ear pain, mucous from coughing. Strep test negative.

## 2022-08-13 ENCOUNTER — Other Ambulatory Visit: Payer: Self-pay | Admitting: Family Medicine

## 2022-08-13 DIAGNOSIS — M1000 Idiopathic gout, unspecified site: Secondary | ICD-10-CM

## 2022-08-15 NOTE — Progress Notes (Deleted)
Name: Amy May   MRN: [id number]    DOB: 04-Jul-1966   Date:08/15/2022       Progress Note  Subjective  Chief Complaint  Sore Throat  HPI  *** Patient Active Problem List   Diagnosis Date Noted   Impingement syndrome of shoulder, left 05/21/2022   Seasonal allergic rhinitis 05/21/2022   Prediabetes 11/16/2021   Localized osteoporosis without current pathological fracture 09/21/2020   Plantar fasciitis, bilateral 06/19/2020   Vitamin D deficiency 06/19/2020   B12 deficiency 06/19/2020   Paresthesia of skin 06/19/2020   Benign paroxysmal positional vertigo 06/19/2020   Bunion, right 06/19/2020   Acquired trigger finger of right middle finger 01/26/2020   Low vitamin D level 07/14/2018   Abnormal TSH 07/14/2018   Paroxysmal tachycardia (HCC) 07/06/2018   Abnormal EKG 07/05/2018   Palpitations 07/05/2018   Chronic right shoulder pain 04/30/2018   Body aches 04/30/2018   Essential hypertension 07/12/2015   Obesity (BMI 30.0-34.9) 07/12/2015   Hyperlipidemia 07/12/2015   Female genuine stress incontinence 11/10/2013   Acquired cyst of kidney 11/10/2013    Past Surgical History:  Procedure Laterality Date   COLONOSCOPY WITH PROPOFOL N/A 05/25/2018   Procedure: COLONOSCOPY WITH PROPOFOL;  Surgeon: Lin Landsman, MD;  Location: [facility] ENDOSCOPY;  Service: Gastroenterology;  Laterality: N/A;   DILATION AND CURETTAGE OF UTERUS     TUBAL LIGATION      Family History  Problem Relation Age of Onset   Heart disease Maternal Aunt    Breast cancer Neg Hx     Social History   Tobacco Use   Smoking status: Never   Smokeless tobacco: Never  Substance Use Topics   Alcohol use: No    Alcohol/week: 0.0 standard drinks of alcohol     Current Outpatient Medications:    allopurinol (ZYLOPRIM) 100 MG tablet, Take 1 tablet (100 mg total) by mouth daily., Disp: 90 tablet, Rfl: 0   colchicine 0.6 MG tablet, TAKE 2 TABS (1.2 MG TOTAL) BY MOUTH DAILY. ONCE AND ONE A DAY FOR  3 DAYS AFTER THAT AS NEEDED, Disp: 30 tablet, Rfl: 0   ibandronate (BONIVA) 150 MG tablet, Take 1 tablet (150 mg total) by mouth every 30 (thirty) days. Take in the morning with a full glass of water, on an empty stomach, and do not take anything else by mouth or lie down for the next 30 min., Disp: 12 tablet, Rfl: 3   levocetirizine (XYZAL) 5 MG tablet, Take 1 tablet (5 mg total) by mouth daily., Disp: 90 tablet, Rfl: 1   meclizine (ANTIVERT) 25 MG tablet, Take 0.5-1 tablets (12.5-25 mg total) by mouth 3 (three) times daily as needed for dizziness., Disp: 20 tablet, Rfl: 1   meloxicam (MOBIC) 15 MG tablet, Take 1 tablet (15 mg total) by mouth daily., Disp: 90 tablet, Rfl: 0   predniSONE (DELTASONE) 10 MG tablet, Take 1 tablet (10 mg total) by mouth 3 (three) times daily with meals., Disp: 15 tablet, Rfl: 0   promethazine-dextromethorphan (PROMETHAZINE-DM) 6.25-15 MG/5ML syrup, Take 5 mLs by mouth 4 (four) times daily as needed for cough., Disp: 118 mL, Rfl: 0   triamterene-hydrochlorothiazide (MAXZIDE-25) 37.5-25 MG tablet, Take 1 tablet by mouth daily., Disp: 90 tablet, Rfl: 1   Vitamin D, Ergocalciferol, (DRISDOL) 1.25 MG (50000 UNIT) CAPS capsule, TAKE 1 CAPSULE (50,000 UNITS TOTAL) BY MOUTH EVERY 7 (SEVEN) DAYS, Disp: 4 capsule, Rfl: 2  No Known Allergies  I personally reviewed active problem list, medication list, allergies, family  history, social history, health maintenance with the patient/caregiver today.   ROS  ***  Objective  There were no vitals filed for this visit.  There is no height or weight on file to calculate BMI.  Physical Exam ***  Recent Results (from the past 2160 hour(s))  Uric acid     Status: Abnormal   Collection Time: 07/19/22  2:33 PM  Result Value Ref Range   Uric Acid, Serum 7.5 (H) 2.5 - 7.0 mg/dL    Comment: Therapeutic target for gout patients: <6.0 mg/dL .   ANA,IFA RA Diag Pnl w/rflx Tit/Patn     Status: None   Collection Time: 07/19/22  2:33  PM  Result Value Ref Range   Anti Nuclear Antibody (ANA) NEGATIVE NEGATIVE    Comment: ANA IFA is a first line screen for detecting the presence of up to approximately 150 autoantibodies in various autoimmune diseases. A negative ANA IFA result suggests an ANA-associated autoimmune disease is not present at this time, but is not definitive. If there is high clinical suspicion for Sjogren's syndrome, testing for anti-SS-A/Ro antibody should be considered. Anti-Jo-1 antibody should be considered for clinically suspected inflammatory myopathies. . AC-0: Negative . International Consensus on ANA Patterns (https://www.hernandez-brewer.com/) . For additional information, please refer to http://education.[facility].com/faq/FAQ177 (This link is being provided for informational/ educational purposes only.) .    Rhuematoid fact SerPl-aCnc 99991111 [id number] IU/mL   Cyclic Citrullin Peptide Ab <16 UNITS    Comment: Reference Range Negative:            <20 Weak Positive:       20-39 Moderate Positive:   40-59 Strong Positive:     >59 .    INTERPRETATION      Comment: . There is no serologic evidence for rheumatoid arthritis. The RF and CCP tests each have a sensitivity for established rheumatoid arthritis of approximately 65-70%. .   Sedimentation rate     Status: None   Collection Time: 07/19/22  2:33 PM  Result Value Ref Range   Sed Rate 22 0 - 30 mm/h  C-reactive protein     Status: None   Collection Time: 07/19/22  2:33 PM  Result Value Ref Range   CRP 5.0 <8.0 mg/L    PHQ2/9:    08/05/2022    7:37 AM 07/31/2022   10:42 AM 07/19/2022    2:08 PM 07/12/2022   10:53 AM 05/21/2022    8:06 AM  Depression screen PHQ 2/9  Decreased Interest 0 0 0 0 0  Down, Depressed, Hopeless 0 0 0 0 0  PHQ - 2 Score 0 0 0 0 0  Altered sleeping 0  0  0  Tired, decreased energy 0  0  0  Change in appetite 0  0  0  Feeling bad or failure about yourself  0  0  0  Trouble concentrating 0   0  0  Moving slowly or fidgety/restless 0  0  0  Suicidal thoughts 0  0  0  PHQ-9 Score 0  0  0    phq 9 is {gen pos [id number]   Fall Risk:    08/05/2022    7:37 AM 07/31/2022   10:42 AM 07/19/2022    2:08 PM 07/12/2022   10:53 AM 05/21/2022    8:06 AM  Fall Risk   Falls in the past year? 0 0 0 0 0  Number falls in past yr: 0 0 0 0 0  Injury with Fall? 0  0 0 0 0  Risk for fall due to : No Fall Risks  No Fall Risks  No Fall Risks  Follow up Falls prevention discussed Falls evaluation completed Falls prevention discussed Falls evaluation completed Falls prevention discussed      Functional Status Survey:      Assessment & Plan  *** There are no diagnoses linked to this encounter.

## 2022-08-16 ENCOUNTER — Ambulatory Visit: Payer: Managed Care, Other (non HMO) | Admitting: Family Medicine

## 2022-08-24 ENCOUNTER — Other Ambulatory Visit: Payer: Self-pay | Admitting: Family Medicine

## 2022-08-24 DIAGNOSIS — M1000 Idiopathic gout, unspecified site: Secondary | ICD-10-CM

## 2022-11-19 NOTE — Progress Notes (Deleted)
Name: Amy May   MRN: [id number]    DOB: 1965-11-23   Date:11/19/2022       Progress Note  Subjective  Chief Complaint  Follow Up  HPI  HTN: she takes medication daily, bp is at goal, no chest pain, palpitation or sob. Still losing some weight and we will continue current dose   Obesity : HTN, hyperlipidemia, prediabetes , she is down 17 lbs in the past 2 years, weight was 202 lbs in Dec 2021 , down to 193 lbs in Sep 2022 and today is down to 185 lbs . She is doing well with life style modifications   Vitamin D and B12 deficiency: resume B12 SL a few times a week, continue vitamin D supplementation   Pre-diabetes: A1C was up to 6% but went down to 5.2 % with Ozempic/ she has been off medication for months now . She has been walking at least one mile 5 days a week during her lunch break . She is still losing weight even though she is no longer taking Ozempic She is also doing intermittent fasting from 8 pm to 2 pm, drinking lots of water.   Hyperlipidemia: not on medication, reviewed levels.   The 10-year ASCVD risk score (Arnett DK, et al., 2019) is: 5.4%   Values used to calculate the score:     Age: 57 years     Sex: Female     Is Non-Hispanic African American: Yes     Diabetic: No     Tobacco smoker: No     Systolic Blood Pressure: 128 mmHg     Is BP treated: Yes     HDL Cholesterol: 52 mg/dL     Total Cholesterol: 181 mg/dL    Impingement shoulder syndrome and Knee pain: seen by Ortho, doing well, had steroid injection, using meloxicam prn only   Osteoporosis of spine: she is taking Boniva and also vitamin D otc. Bone density improved from osteoporosis -2.6 to -1.3 on her last bone density Started in 2022 and we will treat for 5 years   Patient Active Problem List   Diagnosis Date Noted   Impingement syndrome of shoulder, left 05/21/2022   Seasonal allergic rhinitis 05/21/2022   Prediabetes 11/16/2021   Localized osteoporosis without current pathological fracture  09/21/2020   Plantar fasciitis, bilateral 06/19/2020   Vitamin D deficiency 06/19/2020   B12 deficiency 06/19/2020   Paresthesia of skin 06/19/2020   Benign paroxysmal positional vertigo 06/19/2020   Bunion, right 06/19/2020   Acquired trigger finger of right middle finger 01/26/2020   Low vitamin D level 07/14/2018   Abnormal TSH 07/14/2018   Paroxysmal tachycardia (HCC) 07/06/2018   Abnormal EKG 07/05/2018   Palpitations 07/05/2018   Chronic right shoulder pain 04/30/2018   Body aches 04/30/2018   Essential hypertension 07/12/2015   Obesity (BMI 30.0-34.9) 07/12/2015   Hyperlipidemia 07/12/2015   Female genuine stress incontinence 11/10/2013   Acquired cyst of kidney 11/10/2013    Past Surgical History:  Procedure Laterality Date   COLONOSCOPY WITH PROPOFOL N/A 05/25/2018   Procedure: COLONOSCOPY WITH PROPOFOL;  Surgeon: Toney Reil, MD;  Location: [facility] ENDOSCOPY;  Service: Gastroenterology;  Laterality: N/A;   DILATION AND CURETTAGE OF UTERUS     TUBAL LIGATION      Family History  Problem Relation Age of Onset   Heart disease Maternal Aunt    Breast cancer Neg Hx     Social History   Tobacco Use   Smoking status: Never  Smokeless tobacco: Never  Substance Use Topics   Alcohol use: No    Alcohol/week: 0.0 standard drinks of alcohol     Current Outpatient Medications:    allopurinol (ZYLOPRIM) 100 MG tablet, Take 1 tablet (100 mg total) by mouth daily., Disp: 90 tablet, Rfl: 0   colchicine 0.6 MG tablet, TAKE 2 TABS (1.2 MG TOTAL) BY MOUTH DAILY. ONCE AND ONE A DAY FOR 3 DAYS AFTER THAT AS NEEDED, Disp: 30 tablet, Rfl: 0   ibandronate (BONIVA) 150 MG tablet, Take 1 tablet (150 mg total) by mouth every 30 (thirty) days. Take in the morning with a full glass of water, on an empty stomach, and do not take anything else by mouth or lie down for the next 30 min., Disp: 12 tablet, Rfl: 3   levocetirizine (XYZAL) 5 MG tablet, Take 1 tablet (5 mg total) by mouth  daily., Disp: 90 tablet, Rfl: 1   meclizine (ANTIVERT) 25 MG tablet, Take 0.5-1 tablets (12.5-25 mg total) by mouth 3 (three) times daily as needed for dizziness., Disp: 20 tablet, Rfl: 1   meloxicam (MOBIC) 15 MG tablet, Take 1 tablet (15 mg total) by mouth daily., Disp: 90 tablet, Rfl: 0   predniSONE (DELTASONE) 10 MG tablet, Take 1 tablet (10 mg total) by mouth 3 (three) times daily with meals., Disp: 15 tablet, Rfl: 0   promethazine-dextromethorphan (PROMETHAZINE-DM) 6.25-15 MG/5ML syrup, Take 5 mLs by mouth 4 (four) times daily as needed for cough., Disp: 118 mL, Rfl: 0   triamterene-hydrochlorothiazide (MAXZIDE-25) 37.5-25 MG tablet, Take 1 tablet by mouth daily., Disp: 90 tablet, Rfl: 1   Vitamin D, Ergocalciferol, (DRISDOL) 1.25 MG (50000 UNIT) CAPS capsule, TAKE 1 CAPSULE (50,000 UNITS TOTAL) BY MOUTH EVERY 7 (SEVEN) DAYS, Disp: 4 capsule, Rfl: 2  No Known Allergies  I personally reviewed active problem list, medication list, allergies, family history, social history, health maintenance with the patient/caregiver today.   ROS  ***  Objective  There were no vitals filed for this visit.  There is no height or weight on file to calculate BMI.  Physical Exam ***  No results found for this or any previous visit (from the past 2160 hour(s)).   PHQ2/9:    08/05/2022    7:37 AM 07/31/2022   10:42 AM 07/19/2022    2:08 PM 07/12/2022   10:53 AM 05/21/2022    8:06 AM  Depression screen PHQ 2/9  Decreased Interest 0 0 0 0 0  Down, Depressed, Hopeless 0 0 0 0 0  PHQ - 2 Score 0 0 0 0 0  Altered sleeping 0  0  0  Tired, decreased energy 0  0  0  Change in appetite 0  0  0  Feeling bad or failure about yourself  0  0  0  Trouble concentrating 0  0  0  Moving slowly or fidgety/restless 0  0  0  Suicidal thoughts 0  0  0  PHQ-9 Score 0  0  0    phq 9 is {gen pos [id number]}   Fall Risk:    08/05/2022    7:37 AM 07/31/2022   10:42 AM 07/19/2022    2:08 PM 07/12/2022   10:53  AM 05/21/2022    8:06 AM  Fall Risk   Falls in the past year? 0 0 0 0 0  Number falls in past yr: 0 0 0 0 0  Injury with Fall? 0 0 0 0 0  Risk for fall due  to : No Fall Risks  No Fall Risks  No Fall Risks  Follow up Falls prevention discussed Falls evaluation completed Falls prevention discussed Falls evaluation completed Falls prevention discussed      Functional Status Survey:      Assessment & Plan  *** There are no diagnoses linked to this encounter.

## 2022-11-20 ENCOUNTER — Ambulatory Visit: Payer: Managed Care, Other (non HMO) | Admitting: Family Medicine

## 2022-11-24 ENCOUNTER — Other Ambulatory Visit: Payer: Self-pay | Admitting: Family Medicine

## 2022-11-24 DIAGNOSIS — M816 Localized osteoporosis [Lequesne]: Secondary | ICD-10-CM

## 2022-11-24 DIAGNOSIS — E559 Vitamin D deficiency, unspecified: Secondary | ICD-10-CM

## 2022-11-26 ENCOUNTER — Other Ambulatory Visit: Payer: Self-pay | Admitting: Family Medicine

## 2022-12-12 ENCOUNTER — Other Ambulatory Visit: Payer: Self-pay | Admitting: Emergency Medicine

## 2022-12-12 DIAGNOSIS — I10 Essential (primary) hypertension: Secondary | ICD-10-CM

## 2022-12-12 MED ORDER — TRIAMTERENE-HCTZ 37.5-25 MG PO TABS: 1.0000 | ORAL_TABLET | Freq: Every day | ORAL | 0 refills | Status: DC

## 2022-12-12 NOTE — Progress Notes (Deleted)
There were no vitals taken for this visit.   Subjective:    Patient ID: Amy May, female    DOB: 1965-07-28, 57 y.o.   MRN: [id number]  HPI: Amy May is a 57 y.o. female  No chief complaint on file.  HTN: her blood pressure today is ***.  She denies any shortness of breath, headaches, blurred vision or chest pain.  She is currently taking ***.   Obesity : her weight today is *** lbs with a BMI of ***.  Last appointment she was 190 lbs.  Comorbidities include htn, hyperlipidemia, and prediabetes. She has been working on lifestyle modification.    Vitamin D and B12 deficiency: patient has been taking supplements.     Pre-diabetes: last A1C was 5.4 on 02/25/2022. She continues to work on lifestyle modification.    Hyperlipidemia: not on medication,Last LDL was 97 on 02/25/2022.    The 10-year ASCVD risk score (Arnett DK, et al., 2019) is: 5.4%   Values used to calculate the score:     Age: 38 years     Sex: Female     Is Non-Hispanic African American: Yes     Diabetic: No     Tobacco smoker: No     Systolic Blood Pressure: 128 mmHg     Is BP treated: Yes     HDL Cholesterol: 52 mg/dL     Total Cholesterol: 181 mg/dL      Impingement shoulder syndrome and Knee pain: *** seen by Ortho, doing well, had steroid injection, using meloxicam prn only    Osteoporosis of spine: she is taking Boniva and also vitamin D otc. Bone density improved from osteoporosis -2.6 to -1.3 on her last bone density Started in 2022 and we will treat for 5 years. Patient reports she is doing well.   Relevant past medical, surgical, family and social history reviewed and updated as indicated. Interim medical history since our last visit reviewed. Allergies and medications reviewed and updated.  Review of Systems  Constitutional: Negative for fever or weight change.  Respiratory: Negative for cough and shortness of breath.   Cardiovascular: Negative for chest pain or palpitations.   Gastrointestinal: Negative for abdominal pain, no bowel changes.  Musculoskeletal: Negative for gait problem or joint swelling.  Skin: Negative for rash.  Neurological: Negative for dizziness or headache.  No other specific complaints in a complete review of systems (except as listed in HPI above).      Objective:    There were no vitals taken for this visit.  Wt Readings from Last 3 Encounters:  07/19/22 190 lb (86.2 kg)  05/21/22 185 lb (83.9 kg)  02/25/22 188 lb (85.3 kg)    Physical Exam  Constitutional: Patient appears well-developed and well-nourished. Obese *** No distress.  HEENT: head atraumatic, normocephalic, pupils equal and reactive to light, ears ***, neck supple, throat within normal limits Cardiovascular: Normal rate, regular rhythm and normal heart sounds.  No murmur heard. No BLE edema. Pulmonary/Chest: Effort normal and breath sounds normal. No respiratory distress. Abdominal: Soft.  There is no tenderness. Psychiatric: Patient has a normal mood and affect. behavior is normal. Judgment and thought content normal.  Results for orders placed or performed in visit on 07/19/22  Uric acid  Result Value Ref Range   Uric Acid, Serum 7.5 (H) 2.5 - 7.0 mg/dL  ANA,IFA RA Diag Pnl w/rflx Tit/Patn  Result Value Ref Range   Anti Nuclear Antibody (ANA) NEGATIVE NEGATIVE   Rheumatoid fact  SerPl-aCnc <14 <14 IU/mL   Cyclic Citrullin Peptide Ab <16 UNITS   INTERPRETATION    Sedimentation rate  Result Value Ref Range   Sed Rate 22 0 - 30 mm/h  C-reactive protein  Result Value Ref Range   CRP 5.0 <8.0 mg/L      Assessment & Plan:   Problem List Items Addressed This Visit   None    Follow up plan: No follow-ups on file.

## 2022-12-13 ENCOUNTER — Ambulatory Visit: Payer: Managed Care, Other (non HMO) | Admitting: Nurse Practitioner

## 2023-02-11 ENCOUNTER — Ambulatory Visit: Payer: Managed Care, Other (non HMO) | Admitting: Urology

## 2023-02-28 NOTE — Progress Notes (Unsigned)
Name: Amy May   MRN: [id number]    DOB: 05/27/1966   Date:03/03/2023       Progress Note  Subjective  Chief Complaint  Annual Exam  HPI  Patient presents for annual CPE.  Diet: cooks at home, does not eat out. She likes fruit and vegetables  Exercise: continue regular activity   Last Eye Exam: up to date Last Dental Exam: up to date   Flowsheet Row Video Visit from 07/31/2022 in [facility]  AUDIT-C Score 0      Depression: Phq 9 is  negative    03/03/2023    7:43 AM 08/05/2022    7:37 AM 07/31/2022   10:42 AM 07/19/2022    2:08 PM 07/12/2022   10:53 AM  Depression screen PHQ 2/9  Decreased Interest 0 0 0 0 0  Down, Depressed, Hopeless 0 0 0 0 0  PHQ - 2 Score 0 0 0 0 0  Altered sleeping 0 0  0   Tired, decreased energy 0 0  0   Change in appetite 0 0  0   Feeling bad or failure about yourself  0 0  0   Trouble concentrating 0 0  0   Moving slowly or fidgety/restless 0 0  0   Suicidal thoughts 0 0  0   PHQ-9 Score 0 0  0    Hypertension: BP Readings from Last 3 Encounters:  03/03/23 122/70  07/19/22 116/68  05/21/22 128/70   Obesity: Wt Readings from Last 3 Encounters:  03/03/23 194 lb (88 kg)  07/19/22 190 lb (86.2 kg)  05/21/22 185 lb (83.9 kg)   BMI Readings from Last 3 Encounters:  03/03/23 35.48 kg/m  07/19/22 34.75 kg/m  05/21/22 33.84 kg/m     Vaccines:   HPV: N/A Tdap: up to date Shingrix: up to date Pneumonia: up to date Flu: Due - she gets it at work  COVID-19: up to date   Hep C Screening: 06/17/18 STD testing and prevention (HIV/chl/gon/syphilis): 04/30/18 Intimate partner violence: negative screen  Sexual History : not sexually active  Menstrual History/LMP/Abnormal Bleeding: post menopausal  Discussed importance of follow up if any post-menopausal bleeding: yes  Incontinence Symptoms: negative for symptoms   Breast cancer:  - Last Mammogram: 11/05/21 - BRCA gene screening: N/A  Osteoporosis  Prevention : Discussed high calcium and vitamin D supplementation, weight bearing exercises Bone density: 11/05/21  Cervical cancer screening: 02/25/22  Skin cancer: Discussed monitoring for atypical lesions  Colorectal cancer: 05/25/18   Lung cancer:  Low Dose CT Chest recommended if Age 73-80 years, 20 pack-year currently smoking OR have quit w/in 15years. Patient does not qualify for screen   ECG: 08/25/19  Advanced Care Planning: A voluntary discussion about advance care planning including the explanation and discussion of advance directives.  Discussed health care proxy and Living will, and the patient was able to identify a health care proxy as daughter .  Patient does not have a living will and power of attorney of health care   Lipids: Lab Results  Component Value Date   CHOL 181 02/25/2022   CHOL 167 04/13/2021   CHOL 173 06/19/2020   Lab Results  Component Value Date   HDL 52 02/25/2022   HDL 49 (L) 04/13/2021   HDL 48 (L) 06/19/2020   Lab Results  Component Value Date   LDLCALC 97 02/25/2022   LDLCALC 96 04/13/2021   LDLCALC 99 06/19/2020   Lab Results  Component  Value Date   TRIG 220 (H) 02/25/2022   TRIG 127 04/13/2021   TRIG 166 (H) 06/19/2020   Lab Results  Component Value Date   CHOLHDL 3.5 02/25/2022   CHOLHDL 3.4 04/13/2021   CHOLHDL 3.6 06/19/2020   No results found for: "LDLDIRECT"  Glucose: Glucose, Bld  Date Value Ref Range Status  04/13/2021 82 65 - 99 mg/dL Final    Comment:    .            Fasting reference interval .   06/19/2020 82 65 - 99 mg/dL Final    Comment:    .            Fasting reference interval .   06/24/2019 97 65 - 99 mg/dL Final    Comment:    .            Fasting reference interval .     Patient Active Problem List   Diagnosis Date Noted   Impingement syndrome of shoulder, left 05/21/2022   Seasonal allergic rhinitis 05/21/2022   Prediabetes 11/16/2021   Localized osteoporosis without current pathological  fracture 09/21/2020   Plantar fasciitis, bilateral 06/19/2020   Vitamin D deficiency 06/19/2020   B12 deficiency 06/19/2020   Paresthesia of skin 06/19/2020   Benign paroxysmal positional vertigo 06/19/2020   Bunion, right 06/19/2020   Acquired trigger finger of right middle finger 01/26/2020   Low vitamin D level 07/14/2018   Abnormal TSH 07/14/2018   Paroxysmal tachycardia (HCC) 07/06/2018   Abnormal EKG 07/05/2018   Palpitations 07/05/2018   Chronic right shoulder pain 04/30/2018   Body aches 04/30/2018   Essential hypertension 07/12/2015   Obesity (BMI 30.0-34.9) 07/12/2015   Hyperlipidemia 07/12/2015   Female genuine stress incontinence 11/10/2013   Acquired cyst of kidney 11/10/2013    Past Surgical History:  Procedure Laterality Date   COLONOSCOPY WITH PROPOFOL N/A 05/25/2018   Procedure: COLONOSCOPY WITH PROPOFOL;  Surgeon: Toney Reil, MD;  Location: [facility] ENDOSCOPY;  Service: Gastroenterology;  Laterality: N/A;   DILATION AND CURETTAGE OF UTERUS     TUBAL LIGATION      Family History  Problem Relation Age of Onset   Heart disease Maternal Aunt    Breast cancer Neg Hx     Social History   Socioeconomic History   Marital status: Divorced    Spouse name: Not on file   Number of children: 2   Years of education: Not on file   Highest education level: Not on file  Occupational History   Occupation: receptionist    Employer: [employer]  Tobacco Use   Smoking status: Never   Smokeless tobacco: Never  Vaping Use   Vaping status: Never Used  Substance and Sexual Activity   Alcohol use: No    Alcohol/week: 0.0 standard drinks of alcohol   Drug use: No   Sexual activity: Not Currently    Partners: Male  Other Topics Concern   Not on file  Social History Narrative   Not on file   Social Determinants of Health   Financial Resource Strain: Low Risk  (03/03/2023)   Overall Financial Resource Strain (CARDIA)    Difficulty of Paying Living  Expenses: Not hard at all  Food Insecurity: No Food Insecurity (03/03/2023)   Hunger Vital Sign    Worried About Running Out of Food in the Last Year: Never true    Ran Out of Food in the Last Year: Never true  Transportation Needs: No Transportation  Needs (03/03/2023)   PRAPARE - Administrator, Civil Service (Medical): No    Lack of Transportation (Non-Medical): No  Physical Activity: Sufficiently Active (03/03/2023)   Exercise Vital Sign    Days of Exercise per Week: 7 days    Minutes of Exercise per Session: 30 min  Stress: No Stress Concern Present (03/03/2023)   [employer] of Occupational Health - Occupational Stress Questionnaire    Feeling of Stress : Not at all  Social Connections: Moderately Isolated (03/03/2023)   Social Connection and Isolation Panel [NHANES]    Frequency of Communication with Friends and Family: More than three times a week    Frequency of Social Gatherings with Friends and Family: More than three times a week    Attends Religious Services: More than 4 times per year    Active Member of [employer] or Organizations: No    Attends Banker Meetings: Never    Marital Status: Divorced  Catering manager Violence: Not At Risk (03/03/2023)   Humiliation, Afraid, Rape, and Kick questionnaire    Fear of Current or Ex-Partner: No    Emotionally Abused: No    Physically Abused: No    Sexually Abused: No     Current Outpatient Medications:    allopurinol (ZYLOPRIM) 100 MG tablet, Take 1 tablet (100 mg total) by mouth daily., Disp: 90 tablet, Rfl: 0   colchicine 0.6 MG tablet, TAKE 2 TABS (1.2 MG TOTAL) BY MOUTH DAILY. ONCE AND ONE A DAY FOR 3 DAYS AFTER THAT AS NEEDED, Disp: 30 tablet, Rfl: 0   ibandronate (BONIVA) 150 MG tablet, TAKE 1 TABLET (150 MG TOTAL) BY MOUTH EVERY 30 (THIRTY) DAYS. TAKE IN THE MORNING WITH A FULL GLASS OF WATER, ON AN EMPTY STOMACH, AND DO NOT TAKE ANYTHING ELSE BY MOUTH OR LIE DOWN FOR THE NEXT 30 MIN., Disp: 1  tablet, Rfl: 6   levocetirizine (XYZAL) 5 MG tablet, Take 1 tablet (5 mg total) by mouth daily., Disp: 90 tablet, Rfl: 1   meclizine (ANTIVERT) 25 MG tablet, Take 0.5-1 tablets (12.5-25 mg total) by mouth 3 (three) times daily as needed for dizziness., Disp: 20 tablet, Rfl: 1   meloxicam (MOBIC) 15 MG tablet, Take 1 tablet (15 mg total) by mouth daily., Disp: 90 tablet, Rfl: 0   triamterene-hydrochlorothiazide (MAXZIDE-25) 37.5-25 MG tablet, Take 1 tablet by mouth daily., Disp: 90 tablet, Rfl: 0   Vitamin D, Ergocalciferol, (DRISDOL) 1.25 MG (50000 UNIT) CAPS capsule, TAKE 1 CAPSULE (50,000 UNITS TOTAL) BY MOUTH EVERY 7 (SEVEN) DAYS, Disp: 4 capsule, Rfl: 2   predniSONE (DELTASONE) 10 MG tablet, Take 1 tablet (10 mg total) by mouth 3 (three) times daily with meals. (Patient not taking: Reported on 03/03/2023), Disp: 15 tablet, Rfl: 0   promethazine-dextromethorphan (PROMETHAZINE-DM) 6.25-15 MG/5ML syrup, Take 5 mLs by mouth 4 (four) times daily as needed for cough. (Patient not taking: Reported on 03/03/2023), Disp: 118 mL, Rfl: 0  No Known Allergies   ROS  Constitutional: Negative for fever or significant  weight change.  Respiratory: Negative for cough and shortness of breath.   Cardiovascular: Negative for chest pain or palpitations.  Gastrointestinal: Negative for abdominal pain, no bowel changes.  Musculoskeletal: Negative for gait problem or joint swelling.  Skin: Negative for rash.  Neurological: Negative for dizziness or headache.  No other specific complaints in a complete review of systems (except as listed in HPI above).   Objective  Vitals:   03/03/23 0743  BP: 122/70  Pulse: 83  Resp: 16  SpO2: 99%  Weight: 194 lb (88 kg)  Height: 5\' 2"  (1.575 m)    Body mass index is 35.48 kg/m.  Physical Exam  Constitutional: Patient appears well-developed and well-nourished. No distress.  HENT: Head: Normocephalic and atraumatic. Ears: B TMs ok, no erythema or effusion; Nose:  Nose normal. Mouth/Throat: Oropharynx is clear and moist. No oropharyngeal exudate.  Eyes: Conjunctivae and EOM are normal. Pupils are equal, round, and reactive to light. No scleral icterus.  Neck: Normal range of motion. Neck supple. No JVD present. No thyromegaly present.  Cardiovascular: Normal rate, regular rhythm and normal heart sounds.  No murmur heard. No BLE edema. Pulmonary/Chest: Effort normal and breath sounds normal. No respiratory distress. Abdominal: Soft. Bowel sounds are normal, no distension. There is no tenderness. no masses Breast: no lumps or masses, no nipple discharge or rashes FEMALE GENITALIA:  Not done  RECTAL: not done  Musculoskeletal: Normal range of motion, no joint effusions. No gross deformities Neurological: he is alert and oriented to person, place, and time. No cranial nerve deficit. Coordination, balance, strength, speech and gait are normal.  Skin: Skin is warm and dry. No rash noted. No erythema.  Psychiatric: Patient has a normal mood and affect. behavior is normal. Judgment and thought content normal.    Fall Risk:    03/03/2023    7:43 AM 08/05/2022    7:37 AM 07/31/2022   10:42 AM 07/19/2022    2:08 PM 07/12/2022   10:53 AM  Fall Risk   Falls in the past year? 0 0 0 0 0  Number falls in past yr: 0 0 0 0 0  Injury with Fall? 0 0 0 0 0  Risk for fall due to : No Fall Risks No Fall Risks  No Fall Risks   Follow up Falls prevention discussed Falls prevention discussed Falls evaluation completed Falls prevention discussed Falls evaluation completed     Functional Status Survey: Is the patient deaf or have difficulty hearing?: No Does the patient have difficulty seeing, even when wearing glasses/contacts?: No Does the patient have difficulty concentrating, remembering, or making decisions?: No Does the patient have difficulty walking or climbing stairs?: No Does the patient have difficulty dressing or bathing?: No Does the patient have difficulty  doing errands alone such as visiting a doctor's office or shopping?: No   Assessment & Plan  1. Well adult exam  - MM 3D SCREENING MAMMOGRAM BILATERAL BREAST; Future - Lipid panel - COMPLETE METABOLIC PANEL WITH GFR - CBC with Differential/Platelet - B12 and Folate Panel - VITAMIN D 25 Hydroxy (Vit-D Deficiency, Fractures) - Uric acid  2. Breast cancer screening by mammogram  - MM 3D SCREENING MAMMOGRAM BILATERAL BREAST; Future  3. B12 deficiency  - B12 and Folate Panel  4. Essential hypertension  - COMPLETE METABOLIC PANEL WITH GFR - CBC with Differential/Platelet  5. Localized osteoporosis without current pathological fracture  Taking Boniva  6. Idiopathic gout, unspecified chronicity, unspecified site  - Uric acid  7. Vitamin D deficiency  - VITAMIN D 25 Hydroxy (Vit-D Deficiency, Fractures)  8. Abnormal thyroid blood test  - TSH  9. Prediabetes  - Hemoglobin A1c  10. Hypertriglyceridemia  - Lipid panel    -USPSTF grade A and B recommendations reviewed with patient; age-appropriate recommendations, preventive care, screening tests, etc discussed and encouraged; healthy living encouraged; see AVS for patient education given to patient -Discussed importance of 150 minutes of physical activity weekly, eat two servings  of fish weekly, eat one serving of tree nuts ( cashews, pistachios, pecans, almonds.Marland Kitchen) every other day, eat 6 servings of fruit/vegetables daily and drink plenty of water and avoid sweet beverages.   -Reviewed Health Maintenance: Yes.

## 2023-03-03 ENCOUNTER — Encounter: Payer: Self-pay | Admitting: Family Medicine

## 2023-03-03 ENCOUNTER — Ambulatory Visit (INDEPENDENT_AMBULATORY_CARE_PROVIDER_SITE_OTHER): Payer: Managed Care, Other (non HMO) | Admitting: Family Medicine

## 2023-03-03 VITALS — BP 122/70 | HR 83 | Resp 16 | Ht 62.0 in | Wt 194.0 lb

## 2023-03-03 DIAGNOSIS — R7989 Other specified abnormal findings of blood chemistry: Secondary | ICD-10-CM

## 2023-03-03 DIAGNOSIS — E559 Vitamin D deficiency, unspecified: Secondary | ICD-10-CM

## 2023-03-03 DIAGNOSIS — E538 Deficiency of other specified B group vitamins: Secondary | ICD-10-CM

## 2023-03-03 DIAGNOSIS — M816 Localized osteoporosis [Lequesne]: Secondary | ICD-10-CM

## 2023-03-03 DIAGNOSIS — Z0000 Encounter for general adult medical examination without abnormal findings: Secondary | ICD-10-CM | POA: Diagnosis not present

## 2023-03-03 DIAGNOSIS — M1000 Idiopathic gout, unspecified site: Secondary | ICD-10-CM

## 2023-03-03 DIAGNOSIS — E781 Pure hyperglyceridemia: Secondary | ICD-10-CM

## 2023-03-03 DIAGNOSIS — I10 Essential (primary) hypertension: Secondary | ICD-10-CM

## 2023-03-03 DIAGNOSIS — Z1231 Encounter for screening mammogram for malignant neoplasm of breast: Secondary | ICD-10-CM | POA: Diagnosis not present

## 2023-03-03 DIAGNOSIS — R7303 Prediabetes: Secondary | ICD-10-CM

## 2023-03-04 LAB — LIPID PANEL
Cholesterol: 181 mg/dL (ref <200)
HDL: 65 mg/dL (ref >=50)
LDL Cholesterol (Calc): 92 mg/dL
Non-HDL Cholesterol (Calc): 116 mg/dL (ref <130)
Total CHOL/HDL Ratio: 2.8 (calc) (ref <5.0)
Triglycerides: 143 mg/dL (ref <150)

## 2023-03-04 LAB — COMPLETE METABOLIC PANEL WITH GFR
AG Ratio: 1.5 (calc) (ref 1.0–2.5)
ALT: 32 U/L — ABNORMAL HIGH (ref 6–29)
AST: 18 U/L (ref 10–35)
Albumin: 4.5 g/dL (ref 3.6–5.1)
Alkaline phosphatase (APISO): 59 U/L (ref 37–153)
BUN/Creatinine Ratio: 17 (calc) (ref 6–22)
BUN: 18 mg/dL (ref 7–25)
CO2: 29 mmol/L (ref 20–32)
Calcium: 10.1 mg/dL (ref 8.6–10.4)
Chloride: 106 mmol/L (ref 98–110)
Creat: 1.06 mg/dL — ABNORMAL HIGH (ref 0.50–1.03)
Globulin: 3 g/dL (ref 1.9–3.7)
Glucose, Bld: 100 mg/dL (ref 65–139)
Potassium: 4.7 mmol/L (ref 3.5–5.3)
Sodium: 141 mmol/L (ref 135–146)
Total Bilirubin: 0.4 mg/dL (ref 0.2–1.2)
Total Protein: 7.5 g/dL (ref 6.1–8.1)
eGFR: 61 mL/min/{1.73_m2} (ref >=60)

## 2023-03-04 LAB — CBC WITH DIFFERENTIAL/PLATELET
Absolute Monocytes: 313 {cells}/uL (ref 200–950)
Basophils Absolute: 49 {cells}/uL (ref 0–200)
Basophils Relative: 0.9 %
Eosinophils Absolute: 81 cells/uL (ref 15–500)
Eosinophils Relative: 1.5 %
HCT: 41.9 % (ref 35.0–45.0)
Hemoglobin: 13.9 g/dL (ref 11.7–15.5)
Lymphs Abs: 2322 {cells}/uL (ref 850–3900)
MCH: 29.1 pg (ref 27.0–33.0)
MCHC: 33.2 g/dL (ref 32.0–36.0)
MCV: 87.8 fL (ref 80.0–100.0)
MPV: 11.5 fL (ref 7.5–12.5)
Monocytes Relative: 5.8 %
Neutro Abs: 2635 cells/uL (ref 1500–7800)
Neutrophils Relative %: 48.8 %
Platelets: 264 10*3/uL (ref 140–400)
RBC: 4.77 10*6/uL (ref 3.80–5.10)
RDW: 12.7 % (ref 11.0–15.0)
Total Lymphocyte: 43 %
WBC: 5.4 10*3/uL (ref 3.8–10.8)

## 2023-03-04 LAB — TSH: TSH: 0.8 m[IU]/L (ref 0.40–4.50)

## 2023-03-04 LAB — HEMOGLOBIN A1C
Hgb A1c MFr Bld: 5.7 %{Hb} — ABNORMAL HIGH (ref <5.7)
Mean Plasma Glucose: 117 mg/dL
eAG (mmol/L): 6.5 mmol/L

## 2023-03-04 LAB — VITAMIN D 25 HYDROXY (VIT D DEFICIENCY, FRACTURES): Vit D, 25-Hydroxy: 63 ng/mL (ref 30–100)

## 2023-03-04 LAB — B12 AND FOLATE PANEL
Folate: 7.8 ng/mL
Vitamin B-12: 1236 pg/mL — ABNORMAL HIGH (ref 200–1100)

## 2023-03-04 LAB — URIC ACID: Uric Acid, Serum: 7.2 mg/dL — ABNORMAL HIGH (ref 2.5–7.0)

## 2023-03-13 ENCOUNTER — Telehealth: Payer: Managed Care, Other (non HMO) | Admitting: Family Medicine

## 2023-03-24 ENCOUNTER — Ambulatory Visit
Admission: RE | Admit: 2023-03-24 | Discharge: 2023-03-24 | Disposition: A | Discharge status: Home / Self Care | Payer: Managed Care, Other (non HMO) | Source: Ambulatory Visit | Attending: Family Medicine | Admitting: Family Medicine

## 2023-03-24 DIAGNOSIS — Z0000 Encounter for general adult medical examination without abnormal findings: Secondary | ICD-10-CM | POA: Diagnosis present

## 2023-03-24 DIAGNOSIS — Z1231 Encounter for screening mammogram for malignant neoplasm of breast: Secondary | ICD-10-CM | POA: Insufficient documentation

## 2023-03-26 ENCOUNTER — Other Ambulatory Visit: Payer: Self-pay | Admitting: Family Medicine

## 2023-03-26 DIAGNOSIS — M1000 Idiopathic gout, unspecified site: Secondary | ICD-10-CM

## 2023-03-26 MED ORDER — ALLOPURINOL 100 MG PO TABS
100.0000 mg | ORAL_TABLET | Freq: Every day | ORAL | 0 refills | Status: DC
Start: 2023-03-26 — End: 2023-06-27

## 2023-03-31 ENCOUNTER — Other Ambulatory Visit: Payer: Self-pay | Admitting: Nurse Practitioner

## 2023-03-31 DIAGNOSIS — I10 Essential (primary) hypertension: Secondary | ICD-10-CM

## 2023-04-01 NOTE — Telephone Encounter (Signed)
Labs in date  Requested Prescriptions  Pending Prescriptions Disp Refills   triamterene-hydrochlorothiazide (MAXZIDE-25) 37.5-25 MG tablet [Pharmacy Med Name: TRIAMTERENE-HCTZ 37.5-25 MG TB] 90 tablet 0    Sig: TAKE 1 TABLET BY MOUTH EVERY DAY     Cardiovascular: Diuretic Combos Failed - 03/31/2023  1:13 AM      Failed - Cr in normal range and within 180 days    Creat  Date Value Ref Range Status  03/03/2023 1.06 (H) 0.50 - 1.03 mg/dL Final         Passed - K in normal range and within 180 days    Potassium  Date Value Ref Range Status  03/03/2023 4.7 3.5 - 5.3 mmol/L Final         Passed - Na in normal range and within 180 days    Sodium  Date Value Ref Range Status  03/03/2023 141 135 - 146 mmol/L Final  07/12/2015 140 134 - 144 mmol/L Final         Passed - Last BP in normal range    BP Readings from Last 1 Encounters:  03/03/23 122/70         Passed - Valid encounter within last 6 months    Recent Outpatient Visits           4 weeks ago Well adult exam   [facility] [name], MD   7 months ago COVID-19   [facility] [name], MD   8 months ago COVID-19   [facility] [name], FNP   8 months ago Pain in finger of both hands   [facility] [name], MD   8 months ago Acute non-recurrent pansinusitis   [facility] [name], FNP       Future Appointments             In 1 month [name], Danna Hefty, MD [facility], [facility]

## 2023-05-15 NOTE — Progress Notes (Signed)
Name: Amy May   MRN: [id number]    DOB: Dec 12, 1965   Date:05/16/2023       Progress Note  Subjective  Chief Complaint  Follow Up  HPI  HTN: she takes medication daily, bp is at goal, no chest pain, palpitation or sob. Continue medication   Morbid obesity : BMI over 35 with comorbidites  such as  HTN, hyperlipidemia, prediabetes. Her weight was 202 lbs in Dec 2021 , down to 193 lbs in Sep 2022 , 185 lbs Nov 2023 and today is up again at 195 lbs. She states she was on vacation and did not eat well. She is now back on a healthier diet  .  Vitamin D and B12 deficiency: resume B12 SL a few times a week, continue vitamin D supplementation . Recheck levels yearly   Pre-diabetes: A1C was up to 6% but went down to 5.2 % with Ozempic/ she has been off medication for months now  and last level went up to 5.7 % . She has been walking at least one mile 5 days a week during her lunch break . She states weight on her scale is stable  Hyperlipidemia: not on medication, reviewed levels.   The 10-year ASCVD risk score (Arnett DK, et al., 2019) is: 4.6%   Values used to calculate the score:     Age: 57 years     Sex: Female     Is Non-Hispanic African American: Yes     Diabetic: No     Tobacco smoker: No     Systolic Blood Pressure: 126 mmHg     Is BP treated: Yes     HDL Cholesterol: 65 mg/dL     Total Cholesterol: 181 mg/dL    Impingement shoulder syndrome on right side:   seen by Ortho,  and after steroid injection on right side resolved, but is having some left shoulder discomfort only at night. She has meloxicam at home but we will try switching to Celebrex  Osteoporosis of spine: she is taking Boniva and also vitamin D otc. Bone density improved from osteoporosis -2.6 to -1.3 on her last bone density Started in 2022 and we will treat for 5 years . She tolerates medication well   Patient Active Problem List   Diagnosis Date Noted   Impingement syndrome of shoulder, left 05/21/2022    Seasonal allergic rhinitis 05/21/2022   Prediabetes 11/16/2021   Localized osteoporosis without current pathological fracture 09/21/2020   Plantar fasciitis, bilateral 06/19/2020   Vitamin D deficiency 06/19/2020   B12 deficiency 06/19/2020   Paresthesia of skin 06/19/2020   Benign paroxysmal positional vertigo 06/19/2020   Bunion, right 06/19/2020   Acquired trigger finger of right middle finger 01/26/2020   Low vitamin D level 07/14/2018   Abnormal TSH 07/14/2018   Paroxysmal tachycardia (HCC) 07/06/2018   Abnormal EKG 07/05/2018   Palpitations 07/05/2018   Chronic right shoulder pain 04/30/2018   Body aches 04/30/2018   Essential hypertension 07/12/2015   Obesity (BMI 30.0-34.9) 07/12/2015   Hyperlipidemia 07/12/2015   Female genuine stress incontinence 11/10/2013   Acquired cyst of kidney 11/10/2013    Past Surgical History:  Procedure Laterality Date   COLONOSCOPY WITH PROPOFOL N/A 05/25/2018   Procedure: COLONOSCOPY WITH PROPOFOL;  Surgeon: Toney Reil, MD;  Location: [facility] ENDOSCOPY;  Service: Gastroenterology;  Laterality: N/A;   DILATION AND CURETTAGE OF UTERUS     TUBAL LIGATION      Family History  Problem Relation Age of Onset  Heart disease Maternal Aunt    Breast cancer Neg Hx     Social History   Tobacco Use   Smoking status: Never   Smokeless tobacco: Never  Substance Use Topics   Alcohol use: No    Alcohol/week: 0.0 standard drinks of alcohol     Current Outpatient Medications:    allopurinol (ZYLOPRIM) 100 MG tablet, Take 1 tablet (100 mg total) by mouth daily., Disp: 90 tablet, Rfl: 0   colchicine 0.6 MG tablet, TAKE 2 TABS (1.2 MG TOTAL) BY MOUTH DAILY. ONCE AND ONE A DAY FOR 3 DAYS AFTER THAT AS NEEDED, Disp: 30 tablet, Rfl: 0   ibandronate (BONIVA) 150 MG tablet, TAKE 1 TABLET (150 MG TOTAL) BY MOUTH EVERY 30 (THIRTY) DAYS. TAKE IN THE MORNING WITH A FULL GLASS OF WATER, ON AN EMPTY STOMACH, AND DO NOT TAKE ANYTHING ELSE BY MOUTH OR  LIE DOWN FOR THE NEXT 30 MIN., Disp: 1 tablet, Rfl: 6   levocetirizine (XYZAL) 5 MG tablet, Take 1 tablet (5 mg total) by mouth daily., Disp: 90 tablet, Rfl: 1   meclizine (ANTIVERT) 25 MG tablet, Take 0.5-1 tablets (12.5-25 mg total) by mouth 3 (three) times daily as needed for dizziness., Disp: 20 tablet, Rfl: 1   triamterene-hydrochlorothiazide (MAXZIDE-25) 37.5-25 MG tablet, TAKE 1 TABLET BY MOUTH EVERY DAY, Disp: 90 tablet, Rfl: 0   Vitamin D, Ergocalciferol, (DRISDOL) 1.25 MG (50000 UNIT) CAPS capsule, TAKE 1 CAPSULE (50,000 UNITS TOTAL) BY MOUTH EVERY 7 (SEVEN) DAYS, Disp: 4 capsule, Rfl: 2  No Known Allergies  I personally reviewed active problem list, medication list, allergies, family history, social history, health maintenance with the patient/caregiver today.   ROS  Ten systems reviewed and is negative except as mentioned in HPI    Objective   Vitals:   05/16/23 1338  BP: 126/74  Pulse: 90  Resp: 16  SpO2: 98%  Weight: 195 lb (88.5 kg)  Height: 5\' 2"  (1.575 m)    Body mass index is 35.67 kg/m.  Physical Exam  Constitutional: Patient appears well-developed and well-nourished. Obese  No distress.  HEENT: head atraumatic, normocephalic, pupils equal and reactive to light, neck supple Cardiovascular: Normal rate, regular rhythm and normal heart sounds.  No murmur heard. No BLE edema. Pulmonary/Chest: Effort normal and breath sounds normal. No respiratory distress. Abdominal: Soft.  There is no tenderness. Muscular skeletal: pain over left deltoid, normal rom of both shoulders  Psychiatric: Patient has a normal mood and affect. behavior is normal. Judgment and thought content normal.    PHQ2/9:    03/03/2023    7:43 AM 08/05/2022    7:37 AM 07/31/2022   10:42 AM 07/19/2022    2:08 PM 07/12/2022   10:53 AM  Depression screen PHQ 2/9  Decreased Interest 0 0 0 0 0  Down, Depressed, Hopeless 0 0 0 0 0  PHQ - 2 Score 0 0 0 0 0  Altered sleeping 0 0  0   Tired,  decreased energy 0 0  0   Change in appetite 0 0  0   Feeling bad or failure about yourself  0 0  0   Trouble concentrating 0 0  0   Moving slowly or fidgety/restless 0 0  0   Suicidal thoughts 0 0  0   PHQ-9 Score 0 0  0     phq 9 is negative   Fall Risk:    03/03/2023    7:43 AM 08/05/2022    7:37 AM  07/31/2022   10:42 AM 07/19/2022    2:08 PM 07/12/2022   10:53 AM  Fall Risk   Falls in the past year? 0 0 0 0 0  Number falls in past yr: 0 0 0 0 0  Injury with Fall? 0 0 0 0 0  Risk for fall due to : No Fall Risks No Fall Risks  No Fall Risks   Follow up Falls prevention discussed Falls prevention discussed Falls evaluation completed Falls prevention discussed Falls evaluation completed     Assessment & Plan  1. Essential hypertension  - triamterene-hydrochlorothiazide (MAXZIDE-25) 37.5-25 MG tablet; Take 1 tablet by mouth daily.  Dispense: 90 tablet; Refill: 1  2. Idiopathic gout, unspecified chronicity, unspecified site  - Uric acid  3. Vitamin D deficiency  Continue supplements  4. Need for immunization against influenza  - Flu vaccine trivalent PF, 6mos and older(Flulaval,Afluria,Fluarix,Fluzone)  5. Localized osteoporosis without current pathological fracture  Last level improved   6. B12 deficiency  Continue supplements  7. Elevated liver enzymes  - Hepatic function panel  8. Deltoid tendinitis of left shoulder  - celecoxib (CELEBREX) 100 MG capsule; Take 1 capsule (100 mg total) by mouth 2 (two) times daily. In place of meloxicam  Dispense: 180 capsule; Refill: 1  9. Prediabetes  She has been cutting down on carbs  10. Morbid obesity (HCC)  BMI still above 35, she states recently on vacation but will resume a healthy diet

## 2023-05-16 ENCOUNTER — Ambulatory Visit (INDEPENDENT_AMBULATORY_CARE_PROVIDER_SITE_OTHER): Payer: Managed Care, Other (non HMO) | Admitting: Family Medicine

## 2023-05-16 ENCOUNTER — Encounter: Payer: Self-pay | Admitting: Family Medicine

## 2023-05-16 VITALS — BP 126/74 | HR 90 | Resp 16 | Ht 62.0 in | Wt 195.0 lb

## 2023-05-16 DIAGNOSIS — E538 Deficiency of other specified B group vitamins: Secondary | ICD-10-CM

## 2023-05-16 DIAGNOSIS — R748 Abnormal levels of other serum enzymes: Secondary | ICD-10-CM

## 2023-05-16 DIAGNOSIS — M1000 Idiopathic gout, unspecified site: Secondary | ICD-10-CM | POA: Diagnosis not present

## 2023-05-16 DIAGNOSIS — I10 Essential (primary) hypertension: Secondary | ICD-10-CM

## 2023-05-16 DIAGNOSIS — Z23 Encounter for immunization: Secondary | ICD-10-CM

## 2023-05-16 DIAGNOSIS — E559 Vitamin D deficiency, unspecified: Secondary | ICD-10-CM | POA: Diagnosis not present

## 2023-05-16 DIAGNOSIS — M816 Localized osteoporosis [Lequesne]: Secondary | ICD-10-CM

## 2023-05-16 DIAGNOSIS — M778 Other enthesopathies, not elsewhere classified: Secondary | ICD-10-CM

## 2023-05-16 DIAGNOSIS — R7303 Prediabetes: Secondary | ICD-10-CM

## 2023-05-16 DIAGNOSIS — M7582 Other shoulder lesions, left shoulder: Secondary | ICD-10-CM

## 2023-05-16 LAB — URIC ACID: Uric Acid, Serum: 6 mg/dL (ref 2.5–7.0)

## 2023-05-16 LAB — HEPATIC FUNCTION PANEL
AG Ratio: 1.5 (calc) (ref 1.0–2.5)
ALT: 28 U/L (ref 6–29)
AST: 20 U/L (ref 10–35)
Albumin: 4.6 g/dL (ref 3.6–5.1)
Alkaline phosphatase (APISO): 66 U/L (ref 37–153)
Bilirubin, Direct: 0.1 mg/dL (ref 0.0–0.2)
Globulin: 3 g/dL (ref 1.9–3.7)
Indirect Bilirubin: 0.4 mg/dL (ref 0.2–1.2)
Total Bilirubin: 0.5 mg/dL (ref 0.2–1.2)
Total Protein: 7.6 g/dL (ref 6.1–8.1)

## 2023-05-16 MED ORDER — CELECOXIB 100 MG PO CAPS: 100.0000 mg | ORAL_CAPSULE | Freq: Two times a day (BID) | ORAL | 1 refills | Status: DC

## 2023-05-16 MED ORDER — TRIAMTERENE-HCTZ 37.5-25 MG PO TABS
1.0000 | ORAL_TABLET | Freq: Every day | ORAL | 1 refills | Status: DC
Start: 2023-05-16 — End: 2023-10-21

## 2023-06-27 ENCOUNTER — Other Ambulatory Visit: Payer: Self-pay | Admitting: Family Medicine

## 2023-06-27 DIAGNOSIS — M1000 Idiopathic gout, unspecified site: Secondary | ICD-10-CM

## 2023-07-14 ENCOUNTER — Other Ambulatory Visit: Payer: Self-pay

## 2023-07-14 ENCOUNTER — Telehealth (INDEPENDENT_AMBULATORY_CARE_PROVIDER_SITE_OTHER): Payer: Managed Care, Other (non HMO) | Admitting: Nurse Practitioner

## 2023-07-14 DIAGNOSIS — M810 Age-related osteoporosis without current pathological fracture: Secondary | ICD-10-CM | POA: Diagnosis not present

## 2023-07-14 DIAGNOSIS — J0140 Acute pansinusitis, unspecified: Secondary | ICD-10-CM | POA: Diagnosis not present

## 2023-07-14 MED ORDER — AMOXICILLIN-POT CLAVULANATE 875-125 MG PO TABS
1.0000 | ORAL_TABLET | Freq: Two times a day (BID) | ORAL | 0 refills | Status: DC
Start: 2023-07-14 — End: 2023-10-21

## 2023-07-14 MED ORDER — IBANDRONATE SODIUM 150 MG PO TABS
150.0000 mg | ORAL_TABLET | ORAL | 6 refills | Status: DC
Start: 2023-07-14 — End: 2023-10-21

## 2023-07-14 NOTE — Progress Notes (Signed)
Name: Amy May   MRN: [id number]    DOB: 1966-07-05   Date:07/14/2023       Progress Note  Subjective  Chief Complaint  Chief Complaint  Patient presents with   Sinusitis    Covid test negative    I connected with  Amy May  on 07/14/23 at 740 am by a video enabled telemedicine application and verified that I am speaking with the correct person using two identifiers.  I discussed the limitations of evaluation and management by telemedicine and the availability of in person appointments. The patient expressed understanding and agreed to proceed with a virtual visit  Staff also discussed with the patient that there may be a patient responsible charge related to this service. Patient Location: home Provider Location: [facility] Additional Individuals present: alone  HPI  Discussed the use of AI scribe software for clinical note transcription with the patient, who gave verbal consent to proceed.  History of Present Illness   The patient, with a history of allergies, presents with sinus pressure that has been ongoing for several weeks. She denies any fever, cough, or shortness of breath. She has been managing her symptoms with over-the-counter medications, nasal spray, and Xyzal for allergies. She reports that the nasal spray causes a burning sensation. She also notes that she had similar symptoms around the same time last year. Despite her symptoms, she reports feeling fine overall.    Patient Active Problem List   Diagnosis Date Noted   Impingement syndrome of shoulder, left 05/21/2022   Seasonal allergic rhinitis 05/21/2022   Prediabetes 11/16/2021   Localized osteoporosis without current pathological fracture 09/21/2020   Plantar fasciitis, bilateral 06/19/2020   Vitamin D deficiency 06/19/2020   B12 deficiency 06/19/2020   Paresthesia of skin 06/19/2020   Benign paroxysmal positional vertigo 06/19/2020   Bunion, right 06/19/2020   Acquired trigger finger of right  middle finger 01/26/2020   Low vitamin D level 07/14/2018   Abnormal TSH 07/14/2018   Paroxysmal tachycardia (HCC) 07/06/2018   Abnormal EKG 07/05/2018   Palpitations 07/05/2018   Chronic right shoulder pain 04/30/2018   Body aches 04/30/2018   Essential hypertension 07/12/2015   Obesity (BMI 30.0-34.9) 07/12/2015   Hyperlipidemia 07/12/2015   Female genuine stress incontinence 11/10/2013   Acquired cyst of kidney 11/10/2013    Social History   Tobacco Use   Smoking status: Never   Smokeless tobacco: Never  Substance Use Topics   Alcohol use: No    Alcohol/week: 0.0 standard drinks of alcohol     Current Outpatient Medications:    allopurinol (ZYLOPRIM) 100 MG tablet, TAKE 1 TABLET BY MOUTH EVERY DAY, Disp: 30 tablet, Rfl: 2   amoxicillin-clavulanate (AUGMENTIN) 875-125 MG tablet, Take 1 tablet by mouth 2 (two) times daily., Disp: 20 tablet, Rfl: 0   celecoxib (CELEBREX) 100 MG capsule, Take 1 capsule (100 mg total) by mouth 2 (two) times daily. In place of meloxicam, Disp: 180 capsule, Rfl: 1   colchicine 0.6 MG tablet, TAKE 2 TABS (1.2 MG TOTAL) BY MOUTH DAILY. ONCE AND ONE A DAY FOR 3 DAYS AFTER THAT AS NEEDED, Disp: 30 tablet, Rfl: 0   levocetirizine (XYZAL) 5 MG tablet, Take 1 tablet (5 mg total) by mouth daily., Disp: 90 tablet, Rfl: 1   meclizine (ANTIVERT) 25 MG tablet, Take 0.5-1 tablets (12.5-25 mg total) by mouth 3 (three) times daily as needed for dizziness., Disp: 20 tablet, Rfl: 1   triamterene-hydrochlorothiazide (MAXZIDE-25) 37.5-25 MG tablet, Take 1  tablet by mouth daily., Disp: 90 tablet, Rfl: 1   Vitamin D, Ergocalciferol, (DRISDOL) 1.25 MG (50000 UNIT) CAPS capsule, TAKE 1 CAPSULE (50,000 UNITS TOTAL) BY MOUTH EVERY 7 (SEVEN) DAYS, Disp: 4 capsule, Rfl: 2   ibandronate (BONIVA) 150 MG tablet, Take 1 tablet (150 mg total) by mouth every 30 (thirty) days. Take in the morning with a full glass of water, on an empty stomach, and do not take anything else by mouth or  lie down for the next 30 min., Disp: 1 tablet, Rfl: 6  No Known Allergies  I personally reviewed active problem list, medication list, allergies with the patient/caregiver today.  ROS  Ten systems reviewed and is negative except as mentioned in HPI   Objective  Virtual encounter, vitals not obtained.  There is no height or weight on file to calculate BMI.  Nursing Note and Vital Signs reviewed.  Physical Exam  Awake, alert and oriented, speaking in complete sentences  No results found for this or any previous visit (from the past 72 hours).  Assessment & Plan  Problem List Items Addressed This Visit   None Visit Diagnoses       Acute non-recurrent pansinusitis    -  Primary   Relevant Medications   amoxicillin-clavulanate (AUGMENTIN) 875-125 MG tablet     Osteoporosis of lumbar spine       Relevant Medications   ibandronate (BONIVA) 150 MG tablet      Assessment and Plan    Sinusitis Chronic sinus pressure despite use of Flonase, Xyzal, and Sudafed. No fever, cough, or shortness of breath. -Continue Flonase or nasal saline washes, Xyzal, and Sudafed. -Add Mucinex. -Prescribe Augmentin for suspected sinus infection.  Osteoporosis Out of Boniva, which is taken monthly. -Send refill for Boniva to [facility] in [place].          -Red flags and when to present for emergency care or RTC including fever >101.56F, chest pain, shortness of breath, new/worsening/un-resolving symptoms,  reviewed with patient at time of visit. Follow up and care instructions discussed and provided in AVS. - I discussed the assessment and treatment plan with the patient. The patient was provided an opportunity to ask questions and all were answered. The patient agreed with the plan and demonstrated an understanding of the instructions.  I provided 15 minutes of non-face-to-face time during this encounter.  [name], FNP

## 2023-09-24 ENCOUNTER — Other Ambulatory Visit: Payer: Self-pay | Admitting: Family Medicine

## 2023-09-24 DIAGNOSIS — M1000 Idiopathic gout, unspecified site: Secondary | ICD-10-CM

## 2023-10-20 NOTE — Progress Notes (Signed)
   There were no vitals taken for this visit.   Subjective:    Patient ID: Amy May, female    DOB: 1966/02/04, 58 y.o.   MRN: [id number]  HPI: Amy May is a 58 y.o. female  No chief complaint on file.   Discussed the use of AI scribe software for clinical note transcription with the patient, who gave verbal consent to proceed.  History of Present Illness          03/03/2023    7:43 AM 08/05/2022    7:37 AM 07/31/2022   10:42 AM  Depression screen PHQ 2/9  Decreased Interest 0 0 0  Down, Depressed, Hopeless 0 0 0  PHQ - 2 Score 0 0 0  Altered sleeping 0 0   Tired, decreased energy 0 0   Change in appetite 0 0   Feeling bad or failure about yourself  0 0   Trouble concentrating 0 0   Moving slowly or fidgety/restless 0 0   Suicidal thoughts 0 0   PHQ-9 Score 0 0     Relevant past medical, surgical, family and social history reviewed and updated as indicated. Interim medical history since our last visit reviewed. Allergies and medications reviewed and updated.  Review of Systems  Per HPI unless specifically indicated above     Objective:    There were no vitals taken for this visit.  {Vitals History (Optional):23777} Wt Readings from Last 3 Encounters:  05/16/23 195 lb (88.5 kg)  03/03/23 194 lb (88 kg)  07/19/22 190 lb (86.2 kg)    Physical Exam Physical Exam    Results for orders placed or performed in visit on 05/16/23  Uric acid   Collection Time: 05/16/23  1:41 PM  Result Value Ref Range   Uric Acid, Serum 6.0 2.5 - 7.0 mg/dL  Hepatic function panel   Collection Time: 05/16/23  1:41 PM  Result Value Ref Range   Total Protein 7.6 6.1 - 8.1 g/dL   Albumin 4.6 3.6 - 5.1 g/dL   Globulin 3.0 1.9 - 3.7 g/dL (calc)   AG Ratio 1.5 1.0 - 2.5 (calc)   Total Bilirubin 0.5 0.2 - 1.2 mg/dL   Bilirubin, Direct 0.1 0.0 - 0.2 mg/dL   Indirect Bilirubin 0.4 0.2 - 1.2 mg/dL (calc)   Alkaline phosphatase (APISO) 66 37 - 153 U/L   AST 20 10 - 35  U/L   ALT 28 6 - 29 U/L   {Labs (Optional):23779}    Assessment & Plan:   Problem List Items Addressed This Visit   None    Assessment and Plan Assessment & Plan         Follow up plan: No follow-ups on file.

## 2023-10-21 ENCOUNTER — Other Ambulatory Visit: Payer: Self-pay | Admitting: Nurse Practitioner

## 2023-10-21 ENCOUNTER — Ambulatory Visit: Attending: Nurse Practitioner

## 2023-10-21 ENCOUNTER — Ambulatory Visit (INDEPENDENT_AMBULATORY_CARE_PROVIDER_SITE_OTHER): Admitting: Nurse Practitioner

## 2023-10-21 ENCOUNTER — Other Ambulatory Visit: Payer: Self-pay

## 2023-10-21 ENCOUNTER — Encounter: Payer: Self-pay | Admitting: Nurse Practitioner

## 2023-10-21 DIAGNOSIS — M65312 Trigger thumb, left thumb: Secondary | ICD-10-CM

## 2023-10-21 DIAGNOSIS — R002 Palpitations: Secondary | ICD-10-CM

## 2023-10-21 DIAGNOSIS — Z131 Encounter for screening for diabetes mellitus: Secondary | ICD-10-CM | POA: Diagnosis not present

## 2023-10-21 DIAGNOSIS — M810 Age-related osteoporosis without current pathological fracture: Secondary | ICD-10-CM

## 2023-10-21 DIAGNOSIS — R55 Syncope and collapse: Secondary | ICD-10-CM

## 2023-10-21 DIAGNOSIS — M1000 Idiopathic gout, unspecified site: Secondary | ICD-10-CM

## 2023-10-21 DIAGNOSIS — E782 Mixed hyperlipidemia: Secondary | ICD-10-CM | POA: Diagnosis not present

## 2023-10-21 DIAGNOSIS — J302 Other seasonal allergic rhinitis: Secondary | ICD-10-CM

## 2023-10-21 DIAGNOSIS — I10 Essential (primary) hypertension: Secondary | ICD-10-CM | POA: Diagnosis not present

## 2023-10-21 LAB — COMPREHENSIVE METABOLIC PANEL WITH GFR
AG Ratio: 1.6 (calc) (ref 1.0–2.5)
ALT: 26 U/L (ref 6–29)
AST: 15 U/L (ref 10–35)
Albumin: 4.6 g/dL (ref 3.6–5.1)
Alkaline phosphatase (APISO): 53 U/L (ref 37–153)
BUN/Creatinine Ratio: 21 (calc) (ref 6–22)
BUN: 22 mg/dL (ref 7–25)
CO2: 31 mmol/L (ref 20–32)
Calcium: 9.9 mg/dL (ref 8.6–10.4)
Chloride: 103 mmol/L (ref 98–110)
Creat: 1.04 mg/dL — ABNORMAL HIGH (ref 0.50–1.03)
Globulin: 2.8 g/dL (ref 1.9–3.7)
Glucose, Bld: 105 mg/dL — ABNORMAL HIGH (ref 65–99)
Potassium: 4.5 mmol/L (ref 3.5–5.3)
Sodium: 138 mmol/L (ref 135–146)
Total Bilirubin: 0.3 mg/dL (ref 0.2–1.2)
Total Protein: 7.4 g/dL (ref 6.1–8.1)
eGFR: 62 mL/min/{1.73_m2} (ref >=60)

## 2023-10-21 LAB — CBC WITH DIFFERENTIAL/PLATELET
Absolute Lymphocytes: 2320 {cells}/uL (ref 850–3900)
Absolute Monocytes: 382 {cells}/uL (ref 200–950)
Basophils Absolute: 51 {cells}/uL (ref 0–200)
Basophils Relative: 0.9 %
Eosinophils Absolute: 200 {cells}/uL (ref 15–500)
Eosinophils Relative: 3.5 %
HCT: 41.2 % (ref 35.0–45.0)
Hemoglobin: 13.9 g/dL (ref 11.7–15.5)
MCH: 28.6 pg (ref 27.0–33.0)
MCHC: 33.7 g/dL (ref 32.0–36.0)
MCV: 84.8 fL (ref 80.0–100.0)
MPV: 11.7 fL (ref 7.5–12.5)
Monocytes Relative: 6.7 %
Neutro Abs: 2747 {cells}/uL (ref 1500–7800)
Neutrophils Relative %: 48.2 %
Platelets: 283 10*3/uL (ref 140–400)
RBC: 4.86 10*6/uL (ref 3.80–5.10)
RDW: 12.6 % (ref 11.0–15.0)
Total Lymphocyte: 40.7 %
WBC: 5.7 10*3/uL (ref 3.8–10.8)

## 2023-10-21 LAB — LIPID PANEL
Cholesterol: 186 mg/dL (ref <200)
HDL: 49 mg/dL — ABNORMAL LOW (ref >=50)
LDL Cholesterol (Calc): 104 mg/dL — ABNORMAL HIGH
Non-HDL Cholesterol (Calc): 137 mg/dL — ABNORMAL HIGH (ref <130)
Total CHOL/HDL Ratio: 3.8 (calc) (ref <5.0)
Triglycerides: 214 mg/dL — ABNORMAL HIGH (ref <150)

## 2023-10-21 LAB — TSH: TSH: 1.14 m[IU]/L (ref 0.40–4.50)

## 2023-10-21 LAB — URIC ACID: Uric Acid, Serum: 7.1 mg/dL — ABNORMAL HIGH (ref 2.5–7.0)

## 2023-10-21 LAB — HEMOGLOBIN A1C
Hgb A1c MFr Bld: 6.4 %{Hb} — ABNORMAL HIGH (ref <5.7)
Mean Plasma Glucose: 137 mg/dL
eAG (mmol/L): 7.6 mmol/L

## 2023-10-21 MED ORDER — LEVOCETIRIZINE DIHYDROCHLORIDE 5 MG PO TABS
5.0000 mg | ORAL_TABLET | Freq: Every day | ORAL | 1 refills | Status: AC
Start: 2023-10-21 — End: ?

## 2023-10-21 MED ORDER — TRIAMTERENE-HCTZ 37.5-25 MG PO TABS: 1.0000 | ORAL_TABLET | Freq: Every day | ORAL | 1 refills | Status: DC

## 2023-10-21 MED ORDER — ALLOPURINOL 100 MG PO TABS: 100.0000 mg | ORAL_TABLET | Freq: Every day | ORAL | 1 refills | Status: DC

## 2023-10-21 MED ORDER — IBANDRONATE SODIUM 150 MG PO TABS
150.0000 mg | ORAL_TABLET | ORAL | 12 refills | Status: DC
Start: 2023-10-21 — End: 2024-02-03

## 2023-10-21 MED ORDER — ZEPBOUND 2.5 MG/0.5ML ~~LOC~~ SOAJ: 2.5000 mg | SUBCUTANEOUS | 0 refills | Status: DC

## 2023-10-22 ENCOUNTER — Encounter: Payer: Self-pay | Admitting: Nurse Practitioner

## 2023-10-22 NOTE — Telephone Encounter (Signed)
 Pharmacy comment: Alternative Requested:THE PRESCRIBED MEDICATION IS NOT COVERED BY INSURANCE. PLEASE CONSIDER CHANGING TO ONE OF THE SUGGESTED COVERED ALTERNATIVES.

## 2023-11-24 ENCOUNTER — Encounter: Payer: Self-pay | Admitting: Nurse Practitioner

## 2023-11-25 ENCOUNTER — Other Ambulatory Visit (HOSPITAL_COMMUNITY): Payer: Self-pay

## 2023-11-25 ENCOUNTER — Telehealth: Payer: Self-pay | Admitting: Pharmacy Technician

## 2023-11-25 DIAGNOSIS — R55 Syncope and collapse: Secondary | ICD-10-CM

## 2023-11-25 DIAGNOSIS — R002 Palpitations: Secondary | ICD-10-CM

## 2023-11-25 NOTE — Telephone Encounter (Signed)
 Pharmacy Patient Advocate Encounter   Received notification from Pt Calls Messages that prior authorization for ZEPBOUND  2.5MG  is required/requested.   Insurance verification completed.   The patient is insured through [facility] .   Per test claim: PA required; PA submitted to above mentioned insurance via Prompt PA Key/confirmation #/EOC [id number] Status is pending

## 2023-11-25 NOTE — Telephone Encounter (Signed)
 Good morning, I will check on this today and follow up

## 2023-11-25 NOTE — Telephone Encounter (Signed)
 Pharmacy Patient Advocate Encounter  Received notification from [employer] that Prior Authorization for ZEPBOUND  2.5MG  has been APPROVED from 11/25/23 to 05/27/24. Ran test claim, Copay is $24.99. This test claim was processed through [facility]- copay amounts may vary at other pharmacies due to pharmacy/plan contracts, or as the patient moves through the different stages of their insurance plan.   PA #/Case ID/Reference #: [id number]

## 2023-11-27 ENCOUNTER — Other Ambulatory Visit: Payer: Self-pay

## 2023-11-27 ENCOUNTER — Ambulatory Visit: Payer: Self-pay | Admitting: Nurse Practitioner

## 2023-11-27 DIAGNOSIS — R002 Palpitations: Secondary | ICD-10-CM

## 2023-12-09 ENCOUNTER — Other Ambulatory Visit: Payer: Self-pay | Admitting: Nurse Practitioner

## 2023-12-10 ENCOUNTER — Other Ambulatory Visit: Payer: Self-pay

## 2023-12-10 ENCOUNTER — Other Ambulatory Visit: Payer: Self-pay | Admitting: Family Medicine

## 2023-12-10 MED ORDER — TIRZEPATIDE-WEIGHT MANAGEMENT 5 MG/0.5ML ~~LOC~~ SOLN: 5.0000 mg | SUBCUTANEOUS | 0 refills | Status: DC

## 2023-12-11 NOTE — Telephone Encounter (Signed)
 The original prescription was discontinued on 12/10/2023 by Sowles, Krichna, MD for the following reason: Dose change.   Requested Prescriptions  Pending Prescriptions Disp Refills   ZEPBOUND  2.5 MG/0.5ML Pen [Pharmacy Med Name: Zepbound  2.5 MG/0.5ML Subcutaneous Solution Auto-injector] 4 mL 0    Sig: INJECT 2.5MG  INTO THE SKIN  ONCE A WEEK     Off-Protocol Failed - 12/11/2023  4:56 PM      Failed - Medication not assigned to a protocol, review manually.      Failed - Valid encounter within last 12 months    Recent Outpatient Visits           1 month ago Morbid obesity [facility])   [facility] Quinton Buckler, FNP       Future Appointments             In 1 month Agbor-Etang, Polly Brink, MD [facility]

## 2023-12-15 ENCOUNTER — Other Ambulatory Visit: Payer: Self-pay

## 2023-12-15 ENCOUNTER — Other Ambulatory Visit: Payer: Self-pay | Admitting: Family Medicine

## 2023-12-15 MED ORDER — TIRZEPATIDE 5 MG/0.5ML ~~LOC~~ SOAJ
5.0000 mg | SUBCUTANEOUS | 0 refills | Status: DC
Start: 2023-12-15 — End: 2023-12-15

## 2023-12-15 MED ORDER — ZEPBOUND 5 MG/0.5ML ~~LOC~~ SOAJ: 5.0000 mg | SUBCUTANEOUS | 0 refills | Status: DC

## 2024-01-12 ENCOUNTER — Ambulatory Visit: Admitting: Cardiology

## 2024-01-15 ENCOUNTER — Other Ambulatory Visit: Payer: Self-pay | Admitting: Family Medicine

## 2024-01-16 ENCOUNTER — Encounter: Payer: Self-pay | Admitting: Family Medicine

## 2024-01-18 ENCOUNTER — Other Ambulatory Visit: Payer: Self-pay | Admitting: Family Medicine

## 2024-01-18 MED ORDER — ZEPBOUND 5 MG/0.5ML ~~LOC~~ SOAJ: 5.0000 mg | SUBCUTANEOUS | 0 refills | Status: DC

## 2024-01-21 ENCOUNTER — Other Ambulatory Visit: Payer: Self-pay | Admitting: Nurse Practitioner

## 2024-01-21 DIAGNOSIS — M810 Age-related osteoporosis without current pathological fracture: Secondary | ICD-10-CM

## 2024-01-22 ENCOUNTER — Ambulatory Visit: Admitting: Nurse Practitioner

## 2024-01-22 NOTE — Telephone Encounter (Signed)
 Requested Prescriptions  Pending Prescriptions Disp Refills   ibandronate  (BONIVA ) 150 MG tablet [Pharmacy Med Name: IBANDRONATE  SODIUM 150 MG TAB] 1 tablet 6    Sig: TAKE 1 TABLET (150 MG TOTAL) BY MOUTH EVERY 30 (THIRTY) DAYS. TAKE IN THE MORNING WITH A FULL GLASS OF WATER, ON AN EMPTY STOMACH, AND DO NOT TAKE ANYTHING ELSE BY MOUTH OR LIE DOWN FOR THE NEXT 30 MIN.     Endocrinology:  Bisphosphonates Failed - 01/22/2024  5:20 PM      Failed - Cr in normal range and within 360 days    Creat  Date Value Ref Range Status  10/21/2023 1.04 (H) 0.50 - 1.03 mg/dL Final         Failed - Mg Level in normal range and within 360 days    Magnesium  Date Value Ref Range Status  06/17/2018 2.1 1.5 - 2.5 mg/dL Final         Failed - Phosphate in normal range and within 360 days    No results found for: PHOS       Failed - Bone Mineral Density or Dexa Scan completed in the last 2 years      Passed - Ca in normal range and within 360 days    Calcium  Date Value Ref Range Status  10/21/2023 9.9 8.6 - 10.4 mg/dL Final   Calcium, Ion  Date Value Ref Range Status  01/30/2022 5.2 4.7 - 5.5 mg/dL Final         Passed - Vitamin D  in normal range and within 360 days    Vit D, 25-Hydroxy  Date Value Ref Range Status  03/03/2023 63 30 - 100 ng/mL Final    Comment:    Vitamin D  Status         25-OH Vitamin D : . Deficiency:                    <20 ng/mL Insufficiency:             20 - 29 ng/mL Optimal:                 > or = 30 ng/mL . For 25-OH Vitamin D  testing on patients on  D2-supplementation and patients for whom quantitation  of D2 and D3 fractions is required, the Quest[employer]ureD(TM) 25-OH VIT D, (D2,D3), LC/MS/MS is recommended: order  code 07111 (patients >60yrs). . See Note 1 . Note 1 . For additional information, please refer to  http://education.Quest[facility].com/faq/FAQ199  (This link is being provided for informational/ educational purposes only.)          Passed -  eGFR is 30 or above and within 360 days    GFR, Est African American  Date Value Ref Range Status  06/19/2020 78 > OR = 60 mL/min/1.83m2 Final   GFR, Est Non African American  Date Value Ref Range Status  06/19/2020 67 > OR = 60 mL/min/1.10m2 Final   eGFR  Date Value Ref Range Status  10/21/2023 62 > OR = 60 mL/min/1.74m2 Final         Passed - Valid encounter within last 12 months    Recent Outpatient Visits           3 months ago Morbid obesity [facility])   [facility] Gareth Mliss FALCON, FNP       Future Appointments             In 1 month Darron Deatrice LABOR, MD [facility]

## 2024-01-26 ENCOUNTER — Encounter: Payer: Self-pay | Admitting: Family Medicine

## 2024-01-26 DIAGNOSIS — M810 Age-related osteoporosis without current pathological fracture: Secondary | ICD-10-CM

## 2024-02-03 MED ORDER — IBANDRONATE SODIUM 150 MG PO TABS: 150.0000 mg | ORAL_TABLET | ORAL | 5 refills | Status: DC

## 2024-02-13 ENCOUNTER — Encounter: Payer: Self-pay | Admitting: Family Medicine

## 2024-02-13 ENCOUNTER — Ambulatory Visit (INDEPENDENT_AMBULATORY_CARE_PROVIDER_SITE_OTHER): Admitting: Family Medicine

## 2024-02-13 DIAGNOSIS — I10 Essential (primary) hypertension: Secondary | ICD-10-CM

## 2024-02-13 DIAGNOSIS — Z1231 Encounter for screening mammogram for malignant neoplasm of breast: Secondary | ICD-10-CM

## 2024-02-13 DIAGNOSIS — I479 Paroxysmal tachycardia, unspecified: Secondary | ICD-10-CM

## 2024-02-13 DIAGNOSIS — E782 Mixed hyperlipidemia: Secondary | ICD-10-CM

## 2024-02-13 DIAGNOSIS — R7303 Prediabetes: Secondary | ICD-10-CM

## 2024-02-13 MED ORDER — ZEPBOUND 7.5 MG/0.5ML ~~LOC~~ SOAJ: 7.5000 mg | SUBCUTANEOUS | 0 refills | Status: DC

## 2024-02-13 NOTE — Progress Notes (Signed)
 Name: Amy May   MRN: [id number]    DOB: [birth year]   Date:02/13/2024       Progress Note  Subjective  Chief Complaint  Chief Complaint  Patient presents with   Medical Management of Chronic Issues   Discussed the use of AI scribe software for clinical note transcription with the patient, who gave verbal consent to proceed.  History of Present Illness Amy May is a 58 year old female with morbid obesity and hypertension who presents for a three-month follow-up on weight management and medication adjustment.  She started Zepbound  in April, initially at 2.5 mg for one month, then increased to 5 mg for two months. She tolerates the medication well without side effects and notes effective appetite suppression. Her weight decreased from 203 lbs to 192.4 lbs.  She actively manages her weight through diet and exercise, walking 1.5 miles daily during workdays, and has reduced soda intake to one per day while consuming four bottles of water daily. Her diet includes more vegetables, less fried food, and smaller portions. She skips breakfast, eats a salad for lunch, and has a balanced dinner with protein and vegetables.  For hypertension, she continues triamterene  HCTZ daily. No chest pain or palpitations are reported, though she has a history of paroxysmal tachycardia. A previous Zio patch recorded a heart rate range of 63 to 154, with an average of 93, noting a bundle branch block and one run of supraventricular tachycardia.  She has prediabetes with an A1c of 6.4% and is reducing carbohydrate intake. No excessive hunger or thirst is noted, and frequent urination is attributed to her diuretic and water intake.  Her gout is well-managed with daily allopurinol , with no recent attacks. She is not on medication for hyperlipidemia.    Patient Active Problem List   Diagnosis Date Noted   Morbid obesity (HCC) 10/21/2023   Impingement syndrome of shoulder, left 05/21/2022   Seasonal  allergic rhinitis 05/21/2022   Prediabetes 11/16/2021   Localized osteoporosis without current pathological fracture 09/21/2020   Plantar fasciitis, bilateral 06/19/2020   Vitamin D  deficiency 06/19/2020   B12 deficiency 06/19/2020   Paresthesia of skin 06/19/2020   Benign paroxysmal positional vertigo 06/19/2020   Bunion, right 06/19/2020   Acquired trigger finger of right middle finger 01/26/2020   Low vitamin D  level 07/14/2018   Abnormal TSH 07/14/2018   Paroxysmal tachycardia (HCC) 07/06/2018   Abnormal EKG 07/05/2018   Palpitations 07/05/2018   Chronic right shoulder pain 04/30/2018   Body aches 04/30/2018   Essential hypertension 07/12/2015   Obesity (BMI 30.0-34.9) 07/12/2015   Hyperlipidemia 07/12/2015   Female genuine stress incontinence 11/10/2013   Acquired cyst of kidney 11/10/2013    Past Surgical History:  Procedure Laterality Date   COLONOSCOPY WITH PROPOFOL  N/A 05/25/2018   Procedure: COLONOSCOPY WITH PROPOFOL ;  Surgeon: Unk Corinn Skiff, MD;  Location: [facility] ENDOSCOPY;  Service: Gastroenterology;  Laterality: N/A;   DILATION AND CURETTAGE OF UTERUS     TUBAL LIGATION      Family History  Problem Relation Age of Onset   Heart disease Maternal Aunt    Breast cancer Neg Hx     Social History   Tobacco Use   Smoking status: Never   Smokeless tobacco: Never  Substance Use Topics   Alcohol use: No    Alcohol/week: 0.0 standard drinks of alcohol     Current Outpatient Medications:    allopurinol  (ZYLOPRIM ) 100 MG tablet, Take 1 tablet (100 mg total) by mouth  daily., Disp: 90 tablet, Rfl: 1   colchicine  0.6 MG tablet, TAKE 2 TABS (1.2 MG TOTAL) BY MOUTH DAILY. ONCE AND ONE A DAY FOR 3 DAYS AFTER THAT AS NEEDED, Disp: 30 tablet, Rfl: 0   gabapentin (NEURONTIN) 300 MG capsule, Take by mouth 2 (two) times daily., Disp: , Rfl:    ibandronate  (BONIVA ) 150 MG tablet, Take 1 tablet (150 mg total) by mouth every 30 (thirty) days. Take in the morning with a  full glass of water, on an empty stomach, and do not take anything else by mouth or lie down for the next 30 min., Disp: 1 tablet, Rfl: 5   levocetirizine (XYZAL ) 5 MG tablet, Take 1 tablet (5 mg total) by mouth daily., Disp: 90 tablet, Rfl: 1   meclizine  (ANTIVERT ) 25 MG tablet, Take 0.5-1 tablets (12.5-25 mg total) by mouth 3 (three) times daily as needed for dizziness., Disp: 20 tablet, Rfl: 1   triamterene -hydrochlorothiazide (MAXZIDE-25) 37.5-25 MG tablet, Take 1 tablet by mouth daily., Disp: 90 tablet, Rfl: 1  No Known Allergies  I personally reviewed active problem list, medication list, allergies with the patient/caregiver today.   ROS  Ten systems reviewed and is negative except as mentioned in HPI    Objective Physical Exam  CONSTITUTIONAL: Patient appears well-developed and well-nourished.  No distress. HEENT: Head atraumatic, normocephalic, neck supple. CARDIOVASCULAR: Normal rate, regular rhythm and normal heart sounds.  No murmur heard. No BLE edema. PULMONARY: Effort normal and breath sounds normal. No respiratory distress. ABDOMINAL: There is no tenderness or distention. MUSCULOSKELETAL: Normal gait. Without gross motor or sensory deficit. PSYCHIATRIC: Patient has a normal mood and affect. behavior is normal. Judgment and thought content normal.  Vitals:   02/13/24 0758  BP: 120/82  Pulse: 97  Resp: 16  SpO2: 97%  Weight: 192 lb 6.4 oz (87.3 kg)  Height: 5' 2 (1.575 m)    Body mass index is 35.19 kg/m.  No results found for this or any previous visit (from the past 2160 hours).  Diabetic Foot Exam:     PHQ2/9:    02/13/2024    7:55 AM 10/21/2023    7:43 AM 03/03/2023    7:43 AM 08/05/2022    7:37 AM 07/31/2022   10:42 AM  Depression screen PHQ 2/9  Decreased Interest 0 0 0 0 0  Down, Depressed, Hopeless 0 0 0 0 0  PHQ - 2 Score 0 0 0 0 0  Altered sleeping   0 0   Tired, decreased energy   0 0   Change in appetite   0 0   Feeling bad or failure  about yourself    0 0   Trouble concentrating   0 0   Moving slowly or fidgety/restless   0 0   Suicidal thoughts   0 0   PHQ-9 Score   0 0     phq 9 is negative  Fall Risk:    02/13/2024    7:55 AM 10/21/2023    7:43 AM 07/14/2023    7:32 AM 03/03/2023    7:43 AM 08/05/2022    7:37 AM  Fall Risk   Falls in the past year? 0 0 0 0 0  Number falls in past yr: 0 0 0 0 0  Injury with Fall? 0 0 0 0 0  Risk for fall due to : No Fall Risks No Fall Risks  No Fall Risks No Fall Risks  Follow up Falls evaluation completed Falls evaluation  completed Falls prevention discussed Falls prevention discussed Falls prevention discussed      Data saved with a previous flowsheet row definition      Assessment & Plan Morbid obesity BMI 35.19. Weight reduced from 203 lbs to 192.4 lbs over three months. Tolerated Zepbound  well with appetite suppression. Engaged in physical activity and dietary changes. Discussed increasing Zepbound  to 7.5 mg with potential side effects. - Increase Zepbound  to 7.5 mg for three months. - Continue walking 1.5 miles daily, five days a week. - Maintain dietary modifications: reduce soda, increase water, more vegetables, less fruit. - Monitor for Zepbound  side effects: queasiness, smaller portions.  Hypertension Well-controlled with current medication. No chest pain or palpitations. - Continue current antihypertensive regimen (triamterene  HCTZ).  Prediabetes A1c 6.4%. No hyperglycemia symptoms. Engaged in carbohydrate reduction. - Continue dietary modifications to reduce carbohydrates, focus on reducing potatoes and pasta.  Gout Well-controlled with allopurinol . No recent attacks. - Continue daily allopurinol .  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 Dizziness and lightheadedness during exertion. No cardiology follow-up. Discussed cardiology care importance. - keep appointment  with  cardiologist for evaluation and management. - Consider electrophysiology consultation if symptoms persist.

## 2024-02-25 ENCOUNTER — Telehealth: Payer: Self-pay | Admitting: *Deleted

## 2024-02-25 NOTE — Telephone Encounter (Signed)
 Unable to LVM due to full mailbox to verify card hx.

## 2024-02-26 ENCOUNTER — Encounter: Payer: Self-pay | Admitting: Cardiovascular Disease

## 2024-02-26 ENCOUNTER — Ambulatory Visit: Attending: Cardiovascular Disease | Admitting: Cardiovascular Disease

## 2024-02-26 VITALS — BP 120/90 | HR 91 | Ht 62.0 in | Wt 194.5 lb

## 2024-02-26 DIAGNOSIS — I10 Essential (primary) hypertension: Secondary | ICD-10-CM

## 2024-02-26 DIAGNOSIS — R002 Palpitations: Secondary | ICD-10-CM | POA: Diagnosis not present

## 2024-02-26 DIAGNOSIS — R55 Syncope and collapse: Secondary | ICD-10-CM

## 2024-02-26 DIAGNOSIS — E782 Mixed hyperlipidemia: Secondary | ICD-10-CM | POA: Diagnosis not present

## 2024-02-26 DIAGNOSIS — I479 Paroxysmal tachycardia, unspecified: Secondary | ICD-10-CM | POA: Diagnosis not present

## 2024-02-26 NOTE — Progress Notes (Unsigned)
 Cardiology Office Note   Date:  03/01/2024   ID:  Amy May, DOB 03/01/1966, MRN [id number]  PCP:  Sowles, Krichna, MD  Cardiologist:   Deatrice Cage, MD   Chief Complaint  Patient presents with   New Patient (Initial Visit)    Palpitations c/o lightheadedness when active or increase of heart rate discuss zio. Meds reviewed verbally with pt.      History of Present Illness: Amy May is a 58 y.o. female who was referred by Mliss Spray for evaluation of palpitations.  She has known history of obesity, gout, prediabetes and hypertension.  She is not a smoker but does have family history of coronary artery disease. She was seen by Dr. Gollan in 2019 for abnormal EKG and palpitations.  EKG showed right bundle branch block.  Her symptoms were felt to be due to SVT and she was advised to use beta-blockers as needed.  She reports intermittent episodes of palpitations which are usually brief.  She has occasional dizziness with this but no syncope or presyncope.  She denies any chest pain or shortness of breath.  She underwent outpatient ZIO monitor which showed mostly sinus rhythm with 1 run of SVT lasting 5 beats as well as rare PACs and PVCs.  Triggered events did not correlate with arrhythmia.  Past Medical History:  Diagnosis Date   Allergy    Hypertension     Past Surgical History:  Procedure Laterality Date   COLONOSCOPY WITH PROPOFOL  N/A 05/25/2018   Procedure: COLONOSCOPY WITH PROPOFOL ;  Surgeon: Unk Corinn Skiff, MD;  Location: [facility] ENDOSCOPY;  Service: Gastroenterology;  Laterality: N/A;   DILATION AND CURETTAGE OF UTERUS     TUBAL LIGATION       Current Outpatient Medications  Medication Sig Dispense Refill   allopurinol  (ZYLOPRIM ) 100 MG tablet Take 1 tablet (100 mg total) by mouth daily. 90 tablet 1   ibandronate  (BONIVA ) 150 MG tablet Take 1 tablet (150 mg total) by mouth every 30 (thirty) days. Take in the morning with a full glass of water,  on an empty stomach, and do not take anything else by mouth or lie down for the next 30 min. 1 tablet 5   levocetirizine (XYZAL ) 5 MG tablet Take 1 tablet (5 mg total) by mouth daily. 90 tablet 1   tirzepatide  (ZEPBOUND ) 7.5 MG/0.5ML Pen Inject 7.5 mg into the skin once a week. 6 mL 0   triamterene -hydrochlorothiazide (MAXZIDE-25) 37.5-25 MG tablet Take 1 tablet by mouth daily. 90 tablet 1   colchicine  0.6 MG tablet TAKE 2 TABS (1.2 MG TOTAL) BY MOUTH DAILY. ONCE AND ONE A DAY FOR 3 DAYS AFTER THAT AS NEEDED (Patient not taking: Reported on 02/26/2024) 30 tablet 0   gabapentin (NEURONTIN) 300 MG capsule Take by mouth 2 (two) times daily. (Patient not taking: Reported on 02/26/2024)     meclizine  (ANTIVERT ) 25 MG tablet Take 0.5-1 tablets (12.5-25 mg total) by mouth 3 (three) times daily as needed for dizziness. (Patient not taking: Reported on 02/26/2024) 20 tablet 1   No current facility-administered medications for this visit.    Allergies:   Patient has no known allergies.    Social History:  The patient  reports that she has never smoked. She has never used smokeless tobacco. She reports that she does not drink alcohol and does not use drugs.   Family History:  The patient's family history includes Heart disease in her maternal aunt.    ROS:  Please  see the history of present illness.   Otherwise, review of systems are positive for none.   All other systems are reviewed and negative.    PHYSICAL EXAM: VS:  BP (!) 120/90 (BP Location: Right Arm, Cuff Size: Normal)   Pulse 91   Ht 5' 2 (1.575 m)   Wt 194 lb 8 oz (88.2 kg)   SpO2 98%   BMI 35.57 kg/m  , BMI Body mass index is 35.57 kg/m. GEN: Well nourished, well developed, in no acute distress  HEENT: normal  Neck: no JVD, carotid bruits, or masses Cardiac: RRR; no murmurs, rubs, or gallops,no edema  Respiratory:  clear to auscultation bilaterally, normal work of breathing GI: soft, nontender, nondistended, + BS MS: no deformity  or atrophy  Skin: warm and dry, no rash Neuro:  Strength and sensation are intact Psych: euthymic mood, full affect   EKG:  EKG is ordered today. The ekg ordered today demonstrates : Normal sinus rhythm Right bundle branch block    Recent Labs: 10/21/2023: ALT 26; BUN 22; Creat 1.04; Hemoglobin 13.9; Platelets 283; Potassium 4.5; Sodium 138; TSH 1.14    Lipid Panel    Component Value Date/Time   CHOL 186 10/21/2023 0824   CHOL 179 07/12/2015 0939   TRIG 214 (H) 10/21/2023 0824   HDL 49 (L) 10/21/2023 0824   HDL 54 07/12/2015 0939   CHOLHDL 3.8 10/21/2023 0824   LDLCALC 104 (H) 10/21/2023 0824      Wt Readings from Last 3 Encounters:  02/26/24 194 lb 8 oz (88.2 kg)  02/13/24 192 lb 6.4 oz (87.3 kg)  10/21/23 203 lb (92.1 kg)          02/26/2024    9:13 AM  PAD Screen  Previous PAD dx? No  Previous surgical procedure? No  Pain with walking? No  Feet/toe relief with dangling? No  Painful, non-healing ulcers? No  Extremities discolored? No      ASSESSMENT AND PLAN:  1.  Palpitations: Likely due to short runs of SVT but the burden is not significant enough to require treatment at this point.  Continue to monitor her symptoms.  She has underlying right bundle branch block which is overall benign.  2.  Essential hypertension: Blood pressure is reasonably controlled on current medications.  3.  Hyperlipidemia: LDL of 104 on most recent lipid profile.  Given her risk factors, she would benefit from risk stratification with a CT calcium score which was requested today.    Disposition:   FU as needed.  Signed,  Deatrice Cage, MD  03/01/2024 7:49 AM    [facility]

## 2024-02-26 NOTE — Patient Instructions (Signed)
 Medication Instructions:  No changes *If you need a refill on your cardiac medications before your next appointment, please call your pharmacy*  Lab Work: None ordered If you have labs (blood work) drawn today and your tests are completely normal, you will receive your results only by: MyChart Message (if you have MyChart) OR A paper copy in the mail If you have any lab test that is abnormal or we need to change your treatment, we will call you to review the results.  Testing/Procedures: Your physician has recommended that you have CT Coronary Calcium Score.  - $99 out of pocket cost at the time of your test - Call [phone] to schedule at your convenience.  [facility] [address] [place], [place] [address]   Coronary CalciumScan A coronary calcium scan is an imaging test used to look for deposits of calcium and other fatty materials (plaques) in the inner lining of the blood vessels of the heart (coronary arteries). These deposits of calcium and plaques can partly clog and narrow the coronary arteries without producing any symptoms or warning signs. This puts a person at risk for a heart attack. This test can detect these deposits before symptoms develop. Tell a health care provider about: Any allergies you have. All medicines you are taking, including vitamins, herbs, eye drops, creams, and over-the-counter medicines. Any problems you or family members have had with anesthetic medicines. Any blood disorders you have. Any surgeries you have had. Any medical conditions you have. Whether you are pregnant or may be pregnant. What are the risks? Generally, this is a safe procedure. However, problems may occur, including: Harm to a pregnant woman and her unborn baby. This test involves the use of radiation. Radiation exposure can be dangerous to a pregnant woman and her unborn baby. If you are pregnant, you generally should not have this  procedure done. Slight increase in the risk of cancer. This is because of the radiation involved in the test. What happens before the procedure? No preparation is needed for this procedure. What happens during the procedure? You will undress and remove any jewelry around your neck or chest. You will put on a hospital gown. Sticky electrodes will be placed on your chest. The electrodes will be connected to an electrocardiogram (ECG) machine to record a tracing of the electrical activity of your heart. A CT scanner will take pictures of your heart. During this time, you will be asked to lie still and hold your breath for 2-3 seconds while a picture of your heart is being taken. The procedure may vary among health care providers and hospitals. What happens after the procedure? You can get dressed. You can return to your normal activities. It is up to you to get the results of your test. Ask your health care provider, or the department that is doing the test, when your results will be ready. Summary A coronary calcium scan is an imaging test used to look for deposits of calcium and other fatty materials (plaques) in the inner lining of the blood vessels of the heart (coronary arteries). Generally, this is a safe procedure. Tell your health care provider if you are pregnant or may be pregnant. No preparation is needed for this procedure. A CT scanner will take pictures of your heart. You can return to your normal activities after the scan is done. This information is not intended to replace advice given to you by your health care provider. Make sure you  discuss any questions you have with your health care provider. Document Released: 12/28/2007 Document Revised: 05/20/2016 Document Reviewed: 05/20/2016 Elsevier Interactive Patient Education  2017 [employer].   Follow-Up: At [facility], you and your health needs are our priority.  As part of our continuing mission to provide you  with exceptional heart care, our providers are all part of one team.  This team includes your primary Cardiologist (physician) and Advanced Practice Providers or APPs (Physician Assistants and Nurse Practitioners) who all work together to provide you with the care you need, when you need it.  Your next appointment:   Follow up as needed

## 2024-03-08 ENCOUNTER — Ambulatory Visit
Admission: RE | Admit: 2024-03-08 | Discharge: 2024-03-08 | Disposition: A | Discharge status: Home / Self Care | Payer: Self-pay | Source: Ambulatory Visit | Attending: Cardiovascular Disease | Admitting: Cardiovascular Disease

## 2024-03-08 ENCOUNTER — Ambulatory Visit: Payer: Self-pay | Admitting: Cardiovascular Disease

## 2024-03-08 DIAGNOSIS — E782 Mixed hyperlipidemia: Secondary | ICD-10-CM | POA: Insufficient documentation

## 2024-03-25 ENCOUNTER — Ambulatory Visit
Admission: RE | Admit: 2024-03-25 | Discharge: 2024-03-25 | Disposition: A | Discharge status: Home / Self Care | Source: Ambulatory Visit | Attending: Family Medicine | Admitting: Family Medicine

## 2024-03-25 DIAGNOSIS — Z1231 Encounter for screening mammogram for malignant neoplasm of breast: Secondary | ICD-10-CM | POA: Insufficient documentation

## 2024-04-26 ENCOUNTER — Encounter: Payer: Self-pay | Admitting: Family Medicine

## 2024-04-26 ENCOUNTER — Other Ambulatory Visit: Payer: Self-pay | Admitting: Family Medicine

## 2024-04-26 MED ORDER — ZEPBOUND 10 MG/0.5ML ~~LOC~~ SOAJ: 10.0000 mg | SUBCUTANEOUS | 0 refills | Status: DC

## 2024-04-26 MED ORDER — ZEPBOUND 7.5 MG/0.5ML ~~LOC~~ SOAJ: 7.5000 mg | SUBCUTANEOUS | 0 refills | Status: DC

## 2024-05-03 ENCOUNTER — Other Ambulatory Visit: Payer: Self-pay | Admitting: Family Medicine

## 2024-05-12 MED ORDER — ZEPBOUND 10 MG/0.5ML ~~LOC~~ SOAJ: 10.0000 mg | SUBCUTANEOUS | 0 refills | Status: DC

## 2024-05-31 ENCOUNTER — Ambulatory Visit (INDEPENDENT_AMBULATORY_CARE_PROVIDER_SITE_OTHER): Admitting: Family Medicine

## 2024-05-31 ENCOUNTER — Encounter: Payer: Self-pay | Admitting: Family Medicine

## 2024-05-31 VITALS — BP 126/74 | HR 90 | Resp 16 | Ht 62.0 in | Wt 189.7 lb

## 2024-05-31 DIAGNOSIS — M109 Gout, unspecified: Secondary | ICD-10-CM

## 2024-05-31 DIAGNOSIS — E66811 Obesity, class 1: Secondary | ICD-10-CM | POA: Diagnosis not present

## 2024-05-31 DIAGNOSIS — M810 Age-related osteoporosis without current pathological fracture: Secondary | ICD-10-CM

## 2024-05-31 DIAGNOSIS — M8580 Other specified disorders of bone density and structure, unspecified site: Secondary | ICD-10-CM

## 2024-05-31 DIAGNOSIS — E559 Vitamin D deficiency, unspecified: Secondary | ICD-10-CM

## 2024-05-31 DIAGNOSIS — I479 Paroxysmal tachycardia, unspecified: Secondary | ICD-10-CM | POA: Diagnosis not present

## 2024-05-31 DIAGNOSIS — E538 Deficiency of other specified B group vitamins: Secondary | ICD-10-CM

## 2024-05-31 DIAGNOSIS — Z780 Asymptomatic menopausal state: Secondary | ICD-10-CM

## 2024-05-31 DIAGNOSIS — M533 Sacrococcygeal disorders, not elsewhere classified: Secondary | ICD-10-CM

## 2024-05-31 DIAGNOSIS — I10 Essential (primary) hypertension: Secondary | ICD-10-CM

## 2024-05-31 DIAGNOSIS — R7303 Prediabetes: Secondary | ICD-10-CM

## 2024-05-31 DIAGNOSIS — E782 Mixed hyperlipidemia: Secondary | ICD-10-CM

## 2024-05-31 LAB — POCT GLYCOSYLATED HEMOGLOBIN (HGB A1C): Hemoglobin A1C: 5.4 % (ref 4.0–5.6)

## 2024-05-31 MED ORDER — TRIAMTERENE-HCTZ 37.5-25 MG PO TABS: 0.5000 | ORAL_TABLET | Freq: Every day | ORAL | 0 refills | Status: AC

## 2024-05-31 MED ORDER — IBANDRONATE SODIUM 150 MG PO TABS: 150.0000 mg | ORAL_TABLET | ORAL | 5 refills | Status: AC

## 2024-05-31 MED ORDER — CELECOXIB 100 MG PO CAPS: 100.0000 mg | ORAL_CAPSULE | Freq: Two times a day (BID) | ORAL | 0 refills | Status: AC

## 2024-05-31 MED ORDER — ALLOPURINOL 100 MG PO TABS: 100.0000 mg | ORAL_TABLET | Freq: Every day | ORAL | 1 refills | Status: AC

## 2024-05-31 NOTE — Progress Notes (Signed)
 Name: Amy May   MRN: [id number]    DOB: 1966/02/08   Date:05/31/2024       Progress Note  Subjective  Chief Complaint  Chief Complaint  Patient presents with   Medical Management of Chronic Issues   Discussed the use of AI scribe software for clinical note transcription with the patient, who gave verbal consent to proceed.  History of Present Illness Amy May is a 57 year old female with obesity and hypertension who presents for a follow-up visit.  She has experienced weight loss from 203 pounds in April to 189.7 pounds currently after starting Zepbound  for obesity. She began with a 2.5 mg dose and is now on a 10 mg dose. She has reduced sugar intake, particularly in beverages, and increased water consumption. Despite the weight not dropping as expected, she notes a change in clothing fit.  She experiences left lower back pain, described as sharp and localized to the sacroiliac joint area. The pain does not radiate to her legs and is relieved by squatting.  Her hypertension is managed with triamterene  HCTZ 37.5/25 mg, but she has not taken it for two days. Her blood pressure remains normal at 126/74 mmHg. She sometimes feels lightheaded, which she attributes to possibly low blood pressure. She monitors her blood pressure at work.  She takes allopurinol  daily for gout and reports no recent gout attacks. She also takes Boniva  monthly for osteopenia, with her last bone density test in April 2023. She plans to schedule another test soon.  She has a history of paroxysmal supraventricular tachycardia, previously identified on a Holter monitor, but reports no recent palpitations.  Her A1c has improved from 6.4% to 5.4% with Zepbound , indicating better control of her prediabetes. No symptoms of diabetes such as excessive hunger, thirst, or frequent urination.  She takes vitamin D  and B12 supplements, which have improved her levels in the past. She plans to check these levels  during her next physical.    Patient Active Problem List   Diagnosis Date Noted   Morbid obesity (HCC) 10/21/2023   Impingement syndrome of shoulder, left 05/21/2022   Seasonal allergic rhinitis 05/21/2022   Prediabetes 11/16/2021   Localized osteoporosis without current pathological fracture 09/21/2020   Plantar fasciitis, bilateral 06/19/2020   Vitamin D  deficiency 06/19/2020   B12 deficiency 06/19/2020   Paresthesia of skin 06/19/2020   Benign paroxysmal positional vertigo 06/19/2020   Bunion, right 06/19/2020   Acquired trigger finger of right middle finger 01/26/2020   Low vitamin D  level 07/14/2018   Abnormal TSH 07/14/2018   Paroxysmal tachycardia (HCC) 07/06/2018   Abnormal EKG 07/05/2018   Palpitations 07/05/2018   Chronic right shoulder pain 04/30/2018   Body aches 04/30/2018   Essential hypertension 07/12/2015   Obesity (BMI 30.0-34.9) 07/12/2015   Hyperlipidemia 07/12/2015   Female genuine stress incontinence 11/10/2013   Acquired cyst of kidney 11/10/2013    Past Surgical History:  Procedure Laterality Date   COLONOSCOPY WITH PROPOFOL  N/A 05/25/2018   Procedure: COLONOSCOPY WITH PROPOFOL ;  Surgeon: Unk Corinn Skiff, MD;  Location: [facility] ENDOSCOPY;  Service: Gastroenterology;  Laterality: N/A;   DILATION AND CURETTAGE OF UTERUS     TUBAL LIGATION      Family History  Problem Relation Age of Onset   Heart disease Maternal Aunt    Breast cancer Neg Hx     Social History   Tobacco Use   Smoking status: Never   Smokeless tobacco: Never  Substance Use Topics  Alcohol use: No    Alcohol/week: 0.0 standard drinks of alcohol     Current Outpatient Medications:    colchicine  0.6 MG tablet, TAKE 2 TABS (1.2 MG TOTAL) BY MOUTH DAILY. ONCE AND ONE A DAY FOR 3 DAYS AFTER THAT AS NEEDED, Disp: 30 tablet, Rfl: 0   levocetirizine (XYZAL ) 5 MG tablet, Take 1 tablet (5 mg total) by mouth daily., Disp: 90 tablet, Rfl: 1   meclizine  (ANTIVERT ) 25 MG tablet, Take  0.5-1 tablets (12.5-25 mg total) by mouth 3 (three) times daily as needed for dizziness., Disp: 20 tablet, Rfl: 1   tirzepatide  (ZEPBOUND ) 10 MG/0.5ML Pen, Inject 10 mg into the skin once a week., Disp: 6 mL, Rfl: 0   allopurinol  (ZYLOPRIM ) 100 MG tablet, Take 1 tablet (100 mg total) by mouth daily., Disp: 90 tablet, Rfl: 1   celecoxib  (CELEBREX ) 100 MG capsule, Take 1 capsule (100 mg total) by mouth 2 (two) times daily., Disp: 60 capsule, Rfl: 0   ibandronate  (BONIVA ) 150 MG tablet, Take 1 tablet (150 mg total) by mouth every 30 (thirty) days. Take in the morning with a full glass of water, on an empty stomach, and do not take anything else by mouth or lie down for the next 30 min., Disp: 3 tablet, Rfl: 5   triamterene -hydrochlorothiazide (MAXZIDE-25) 37.5-25 MG tablet, Take 0.5-1 tablets by mouth daily., Disp: 90 tablet, Rfl: 0  No Known Allergies  I personally reviewed active problem list, medication list, allergies, family history with the patient/caregiver today.   ROS  Ten systems reviewed and is negative except as mentioned in HPI    Objective Physical Exam  CONSTITUTIONAL: Patient appears well-developed and well-nourished.  No distress. HEENT: Head atraumatic, normocephalic, neck supple. CARDIOVASCULAR: Normal rate, regular rhythm and normal heart sounds.  No murmur heard. No BLE edema. PULMONARY: Effort normal and breath sounds normal. No respiratory distress. ABDOMINAL: There is no tenderness or distention. MUSCULOSKELETAL: Normal gait. Without gross motor or sensory deficit. PSYCHIATRIC: Patient has a normal mood and affect. behavior is normal. Judgment and thought content normal.  Vitals:   05/31/24 0825  BP: 126/74  Pulse: 90  Resp: 16  SpO2: 98%  Weight: 189 lb 11.2 oz (86 kg)  Height: 5' 2 (1.575 m)    Body mass index is 34.7 kg/m.  Recent Results (from the past 2160 hours)  POCT glycosylated hemoglobin (Hb A1C)     Status: None   Collection Time: 05/31/24   8:32 AM  Result Value Ref Range   Hemoglobin A1C 5.4 4.0 - 5.6 %   HbA1c POC (<> result, manual entry)     HbA1c, POC (prediabetic range)     HbA1c, POC (controlled diabetic range)      PHQ2/9:    05/31/2024    8:16 AM 02/13/2024    7:55 AM 10/21/2023    7:43 AM 03/03/2023    7:43 AM 08/05/2022    7:37 AM  Depression screen PHQ 2/9  Decreased Interest 0 0 0 0 0  Down, Depressed, Hopeless 0 0 0 0 0  PHQ - 2 Score 0 0 0 0 0  Altered sleeping    0 0  Tired, decreased energy    0 0  Change in appetite    0 0  Feeling bad or failure about yourself     0 0  Trouble concentrating    0 0  Moving slowly or fidgety/restless    0 0  Suicidal thoughts    0 0  PHQ-9 Score    0  0      Data saved with a previous flowsheet row definition    phq 9 is negative  Fall Risk:    05/31/2024    8:16 AM 02/13/2024    7:55 AM 10/21/2023    7:43 AM 07/14/2023    7:32 AM 03/03/2023    7:43 AM  Fall Risk   Falls in the past year? 0 0 0 0 0  Number falls in past yr: 0 0 0 0 0  Injury with Fall? 0 0 0 0 0  Risk for fall due to : No Fall Risks No Fall Risks No Fall Risks  No Fall Risks  Follow up Falls evaluation completed Falls evaluation completed Falls evaluation completed Falls prevention discussed Falls prevention discussed     Assessment & Plan Obesity, class 1 with prediabetes Weight loss achieved with Zepbound , A1c improved to 5.4. Continued dietary modifications necessary. - Continue Zepbound  10 mg. - Advised elimination of sugar from beverages and use of milk or half and half in coffee. - Encouraged portion control and increased intake of protein and vegetables. - Scheduled follow-up in three months for weight management.  Essential hypertension Hypertension well-controlled. Potential for medication adjustment due to weight loss and dietary changes. - Prescribed triamterene  HCTZ at a lower dose, allowing for half a pill daily. - Instructed to monitor blood pressure at work and adjust  medication based on readings. - If blood pressure is below 120/70 mmHg, discontinue triamterene  and continue HCTZ only.  Gout, stable on allopurinol  Gout well-managed with allopurinol . - Continue allopurinol  daily.  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 Chiropractic care recommended. - Recommended chiropractic care for sacroiliac joint pain management. - Advised on maintaining an active lifestyle to alleviate symptoms.  Age-related osteoporosis and other specified disorders of bone density and structure Osteopenia with last bone density scan in April 2023. Currently on Boniva . - Ordered bone density scan to be scheduled with next mammogram. - Continue Boniva  once a month.  Paroxysmal tachycardia, stable Occasional lightheadedness may relate to blood pressure medication. - Monitor blood pressure during episodes of lightheadedness. - Adjust blood pressure medication as needed based on symptoms and blood pressure readings.

## 2024-06-04 ENCOUNTER — Other Ambulatory Visit: Payer: Self-pay | Admitting: Family Medicine

## 2024-06-04 MED ORDER — ZEPBOUND 10 MG/0.5ML ~~LOC~~ SOAJ: 10.0000 mg | SUBCUTANEOUS | 0 refills | Status: AC

## 2024-06-04 NOTE — Addendum Note (Signed)
 Addended by: YVONE WARREN BROCKS on: 06/04/2024 10:44 AM   Modules accepted: Orders

## 2024-06-05 NOTE — Telephone Encounter (Signed)
 Refilled 06/04/24. Requested Prescriptions  Refused Prescriptions Disp Refills   ZEPBOUND  10 MG/0.5ML Pen [Pharmacy Med Name: Zepbound  10 MG/0.5ML Subcutaneous Solution Auto-injector] 4 mL 0    Sig: INJECT 10 MG(S) INTO THE SKIN ONCE A WEEK     Off-Protocol Failed - 06/05/2024  5:06 PM      Failed - Medication not assigned to a protocol, review manually.      Passed - Valid encounter within last 12 months    Recent Outpatient Visits           5 days ago Obesity (BMI 30.0-34.9)   [facility] Glenard Mire, MD   3 months ago Morbid obesity [facility])   [place] [facility] Glenard Mire, MD   7 months ago Morbid obesity [facility])   [facility] Gareth Mliss [place], [place]

## 2024-06-09 ENCOUNTER — Other Ambulatory Visit (HOSPITAL_COMMUNITY): Payer: Self-pay

## 2024-06-09 ENCOUNTER — Telehealth: Payer: Self-pay | Admitting: Pharmacy Technician

## 2024-06-09 NOTE — Telephone Encounter (Signed)
 Pharmacy Patient Advocate Encounter  Received notification from [employer] that Prior Authorization for Zepbound  10 mg/0.12ml pen has been APPROVED from 06/09/24 to 06/08/25. Ran test claim, Copay is $34.53. This test claim was processed through [facility]- copay amounts may vary at other pharmacies due to pharmacy/plan contracts, or as the patient moves through the different stages of their insurance plan.   PA #/Case ID/Reference #: [id number]

## 2024-06-09 NOTE — Telephone Encounter (Signed)
 No ma'am, I will submit the PA right away

## 2024-06-09 NOTE — Telephone Encounter (Signed)
 Pharmacy Patient Advocate Encounter   Received notification from Pt Calls Messages that prior authorization for Zepbound  10 mg/0.5ml pen is required/requested.   Insurance verification completed.   The patient is insured through [employer].   Per test claim: PA required; PA submitted to above mentioned insurance via Prompt PA Key/confirmation #/EOC [id number] Status is pending

## 2024-08-10 ENCOUNTER — Ambulatory Visit

## 2024-09-01 ENCOUNTER — Encounter: Admitting: Family Medicine

## 2024-09-07 ENCOUNTER — Ambulatory Visit
